# Patient Record
Sex: Male | Born: 1965 | Race: White | Hispanic: No | Marital: Married | Smoking: Current every day smoker
Health system: Southern US, Community
[De-identification: ages and names within clinical notes are randomized; demographics above are authoritative.]

## PROBLEM LIST (undated history)

## (undated) HISTORY — PX: GASTRIC BYPASS: SHX52

## (undated) HISTORY — PX: APPENDECTOMY: SHX54

## (undated) HISTORY — PX: LASER ABLATION: SHX1947

---

## 2006-11-28 ENCOUNTER — Ambulatory Visit: Payer: Self-pay | Admitting: Gastroenterology

## 2006-11-28 IMAGING — US ABDOMEN ULTRASOUND
1 series · 17 of 25 positions shown · non-contrast
Comparison: none

REASON FOR EXAM: Abd Pain Generalized
COMMENTS:

[Series 1: abdomen ultrasound · 17 of 56 slices shown]
[im 1/56]
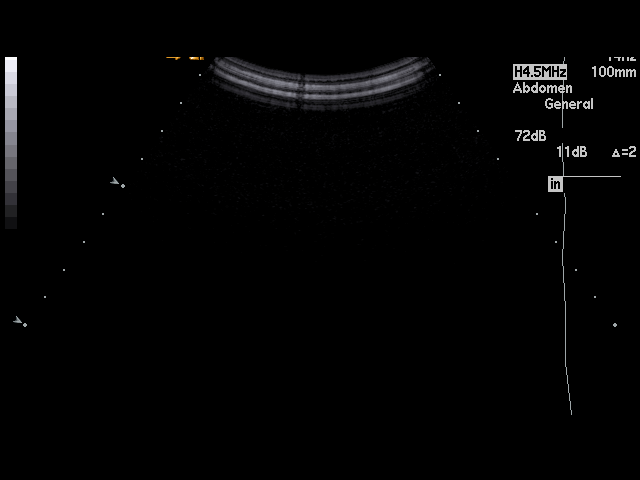
[im 5/56]
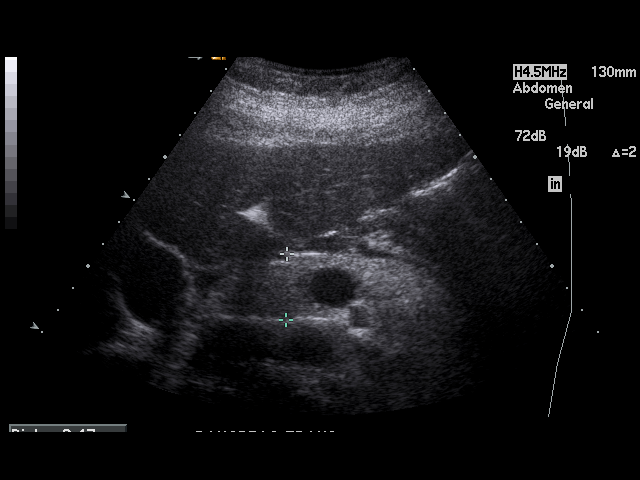
[im 7/56]
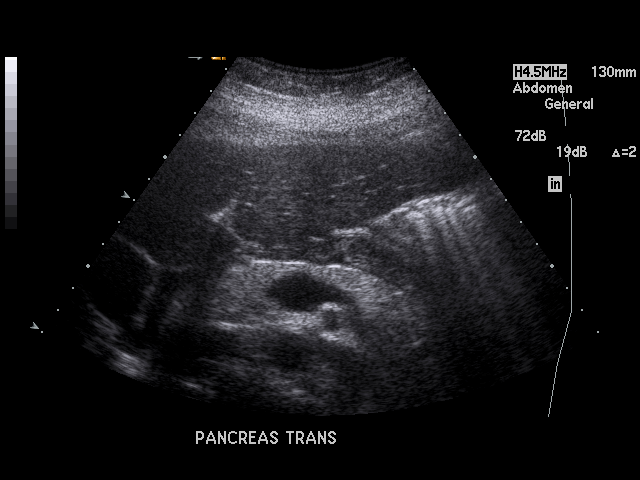
[im 12/56]
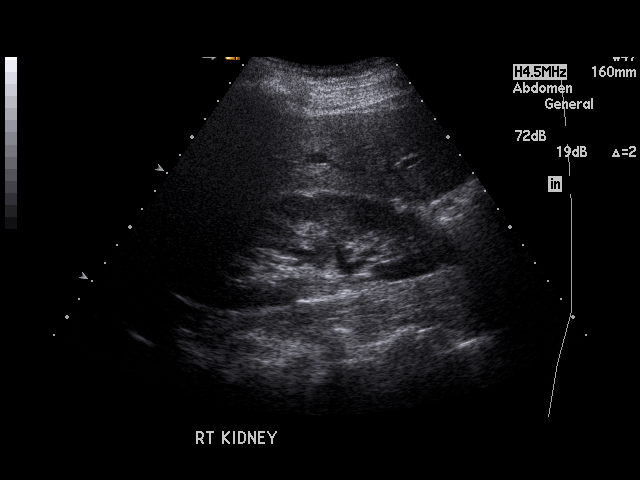
[im 14/56]
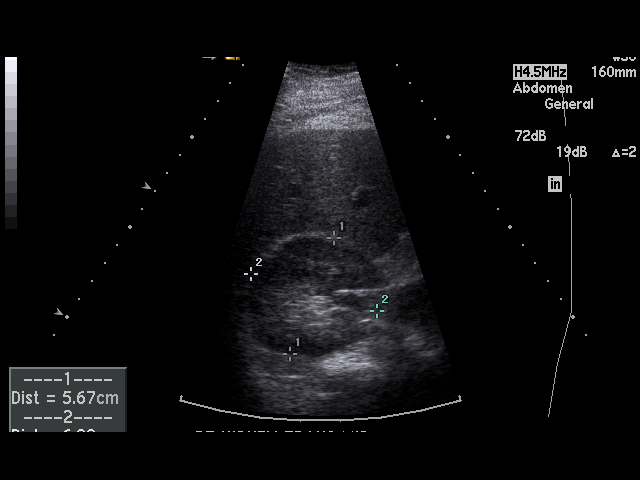
[im 19/56]
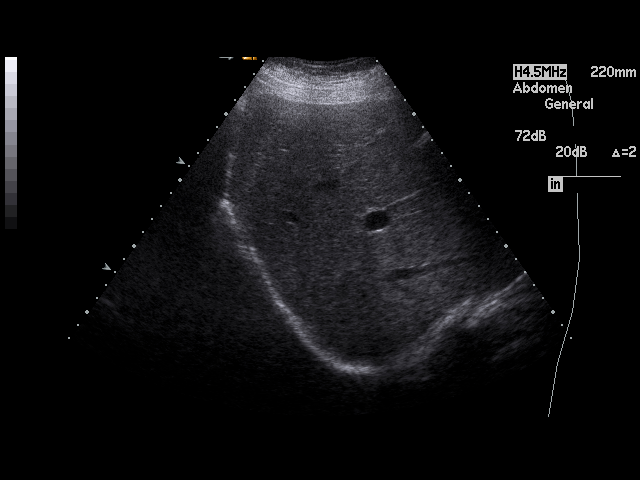
[im 21/56]
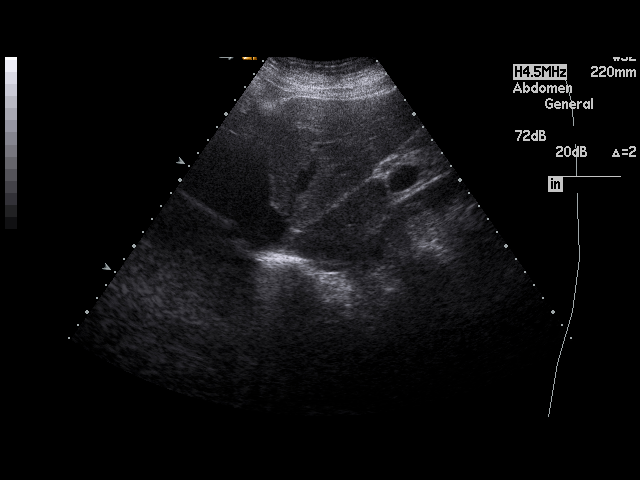
[im 26/56]
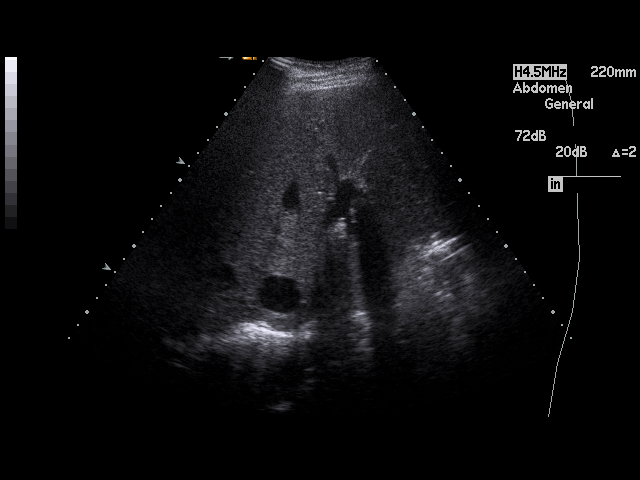
[im 28/56]
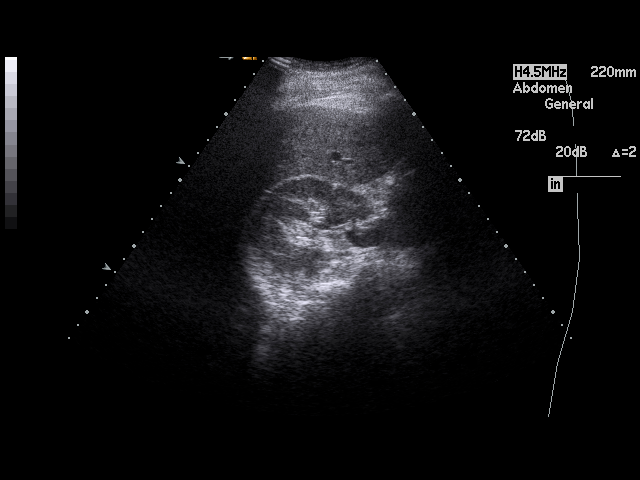
[im 30/56]
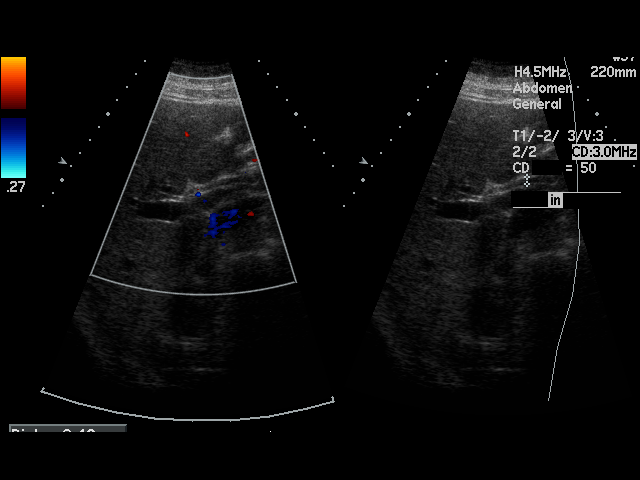
[im 35/56]
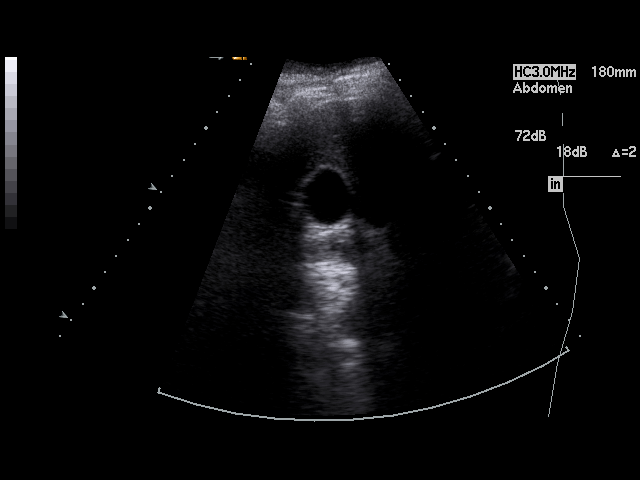
[im 37/56]
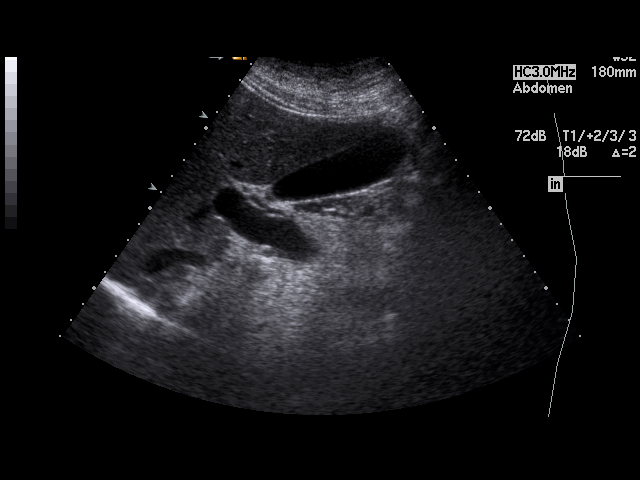
[im 42/56]
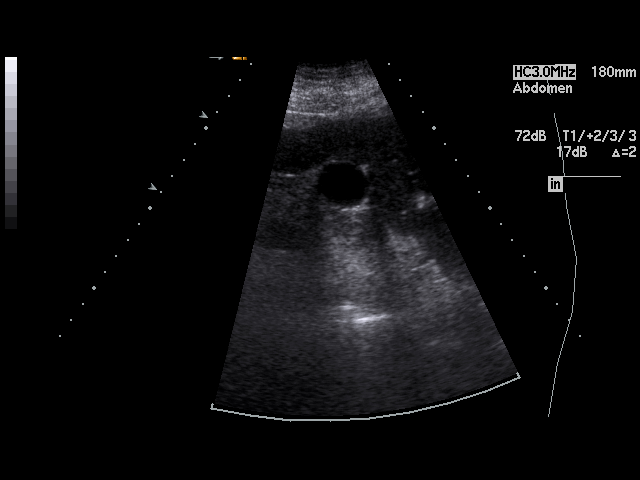
[im 44/56]
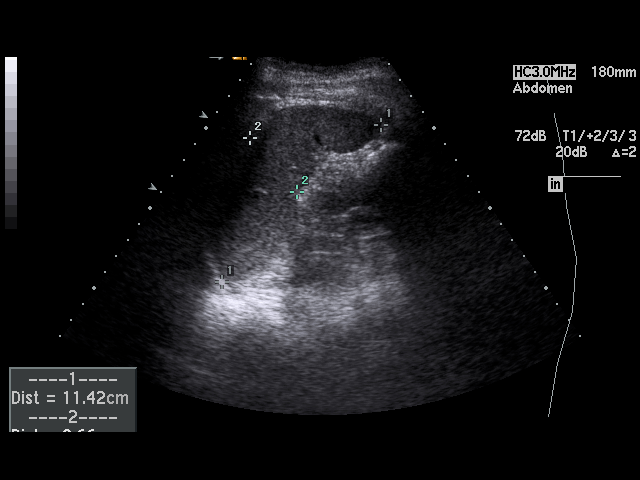
[im 49/56]
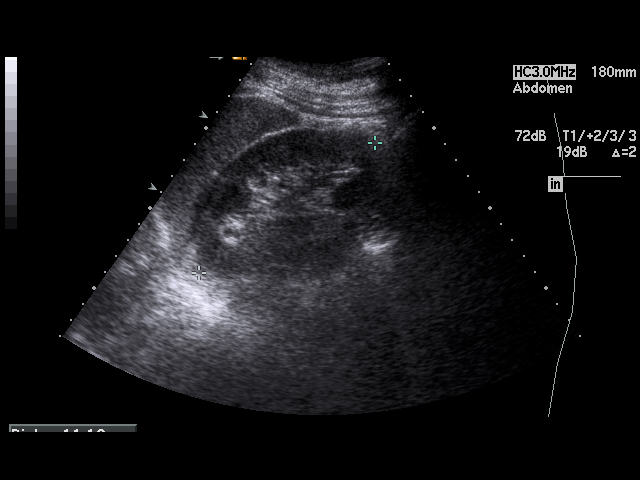
[im 51/56]
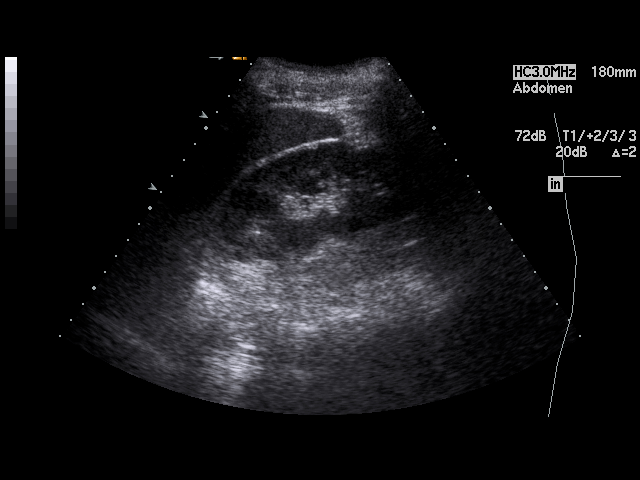
[im 56/56]
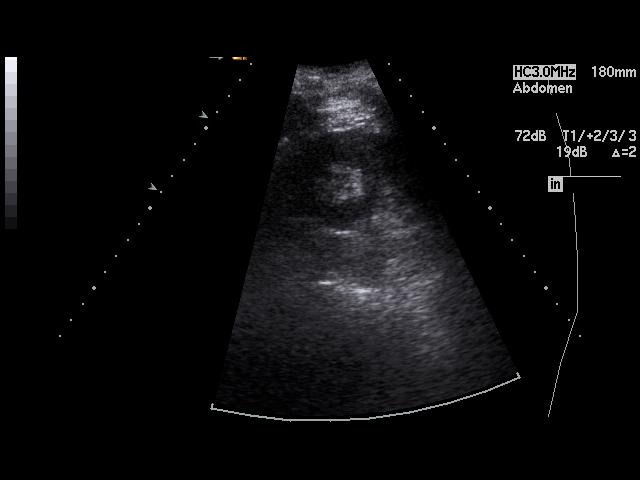

[17 of 25 positions shown; findings below may reference images not displayed]

PROCEDURE:     US  - US ABDOMEN GENERAL SURVEY  - [DATE]  [DATE]

RESULT:     The liver, spleen, pancreas and abdominal aorta show no
significant abnormalities. No gallstones are seen. There is no thickening of
the gallbladder wall. The common bile duct measured 4.3 mm in diameter which
is within normal. The kidneys show no hydronephrosis. There is no ascites.
IMPRESSION: No significant abnormalities are noted.

## 2007-01-02 ENCOUNTER — Ambulatory Visit: Payer: Self-pay | Admitting: Gastroenterology

## 2007-01-02 IMAGING — NM NUCLEAR MEDICINE HEPATOHBILIARY INCLUDE GB
5 series · 26 of 26 positions shown · non-contrast
Comparison: none

REASON FOR EXAM: epigastric pain
COMMENTS:

[Series 1000: gallbladder dynamic · 4.80mm/px · 6 of 60 frames shown]
[frame 6/60]
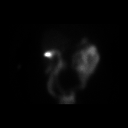
[frame 16/60]
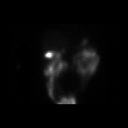
[frame 26/60]
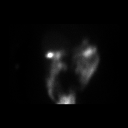
[frame 36/60]
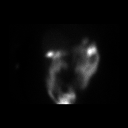
[frame 46/60]
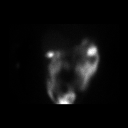
[frame 56/60]
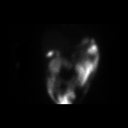

[Series 1000: gallbladder statics · 4.80mm/px · 5 of 5 slices shown (1 of 2)]
[im 1/5]
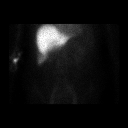
[im 2/5]
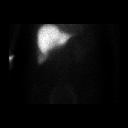
[im 3/5]
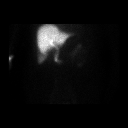
[im 4/5]
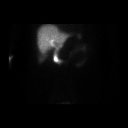
[im 5/5]
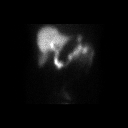

[Series 1000: gallbladder statics · 4.80mm/px · 2 of 2 slices shown (2 of 2)]
[im 1/2]
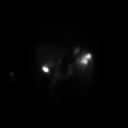
[im 2/2]
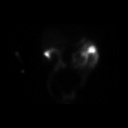

[Series 1000: gallbladder dynamic (results) · 4.80mm/px · 6 of 60 frames shown]
[frame 6/60]
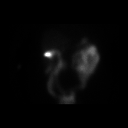
[frame 16/60]
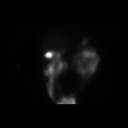
[frame 26/60]
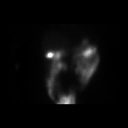
[frame 36/60]
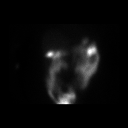
[frame 46/60]
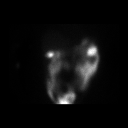
[frame 56/60]
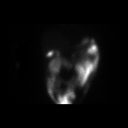

[Series 1000: gallbladder statics (concatenated) · 4.80mm/px · 7 of 7 slices shown]
[im 1/7]
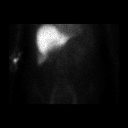
[im 2/7]
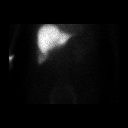
[im 3/7]
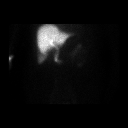
[im 4/7]
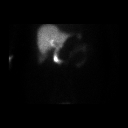
[im 5/7]
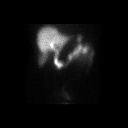
[im 6/7]
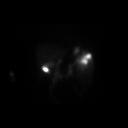
[im 7/7]
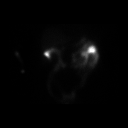

[26 of 26 positions shown; findings below may reference images not displayed]

PROCEDURE:     NM  - NM HEPATO WITH GB EJECT FRACTION  - [DATE]  [DATE]

RESULT:      The patient received 7.4 mCi of technetium 99m labeled
Choletec. The patient also received an intravenous drip infusion of 1.8 mcg
of sincalide over 30 minutes. There is adequate uptake of the
radiopharmaceutical by the liver. Activity is present within the common bile
duct and proximal bowel x 10 minutes. The gallbladder is visualized after
approximately 50 minutes. The 30 minute gallbladder ejection fraction is
normal at 74%.
IMPRESSION: Normal hepatobiliary scan and gallbladder ejection fraction.

## 2015-06-27 ENCOUNTER — Other Ambulatory Visit: Payer: Self-pay | Admitting: Nurse Practitioner

## 2015-06-27 DIAGNOSIS — R1903 Right lower quadrant abdominal swelling, mass and lump: Secondary | ICD-10-CM

## 2015-06-28 ENCOUNTER — Other Ambulatory Visit: Payer: Self-pay | Admitting: Nurse Practitioner

## 2015-06-28 DIAGNOSIS — Z9049 Acquired absence of other specified parts of digestive tract: Secondary | ICD-10-CM

## 2015-06-28 DIAGNOSIS — R10823 Right lower quadrant rebound abdominal tenderness: Secondary | ICD-10-CM

## 2015-06-28 DIAGNOSIS — R1903 Right lower quadrant abdominal swelling, mass and lump: Secondary | ICD-10-CM

## 2015-07-04 ENCOUNTER — Ambulatory Visit
Admission: RE | Admit: 2015-07-04 | Discharge: 2015-07-04 | Disposition: A | Discharge status: Home / Self Care | Payer: BC Managed Care – PPO | Source: Ambulatory Visit | Attending: Nurse Practitioner | Admitting: Nurse Practitioner

## 2015-07-04 DIAGNOSIS — R10823 Right lower quadrant rebound abdominal tenderness: Secondary | ICD-10-CM

## 2015-07-04 DIAGNOSIS — Z9884 Bariatric surgery status: Secondary | ICD-10-CM | POA: Diagnosis not present

## 2015-07-04 DIAGNOSIS — N400 Enlarged prostate without lower urinary tract symptoms: Secondary | ICD-10-CM | POA: Insufficient documentation

## 2015-07-04 DIAGNOSIS — R1903 Right lower quadrant abdominal swelling, mass and lump: Secondary | ICD-10-CM

## 2015-07-04 DIAGNOSIS — Z9049 Acquired absence of other specified parts of digestive tract: Secondary | ICD-10-CM

## 2015-07-04 IMAGING — CT CT ABD-PELV W/ CM
1 of 3 series · 13 of 32 positions shown, 18 images · IV contrast (APPLIED)
Comparison: None.

CLINICAL DATA: 49-year-old with acute onset of right lower quadrant
abdominal pain and swelling which began approximately 1 week ago.
Surgical history includes appendectomy (ruptured appendix) in [DATE] in [place] and prior gastric bypass in [[place]].

EXAM:
CT ABDOMEN AND PELVIS WITH CONTRAST
TECHNIQUE: Multidetector CT imaging of the abdomen and pelvis was performed
using the standard protocol following bolus administration of
intravenous contrast.
CONTRAST:  100 ml Isovue 370 IV. Oral contrast was also
administered.

[Series 2: axial st · axial · 0.84mm/px · z∈[-1028,-578]mm · 13 of 104 slices shown, 18 images]
[im 7/104  soft-tissue]
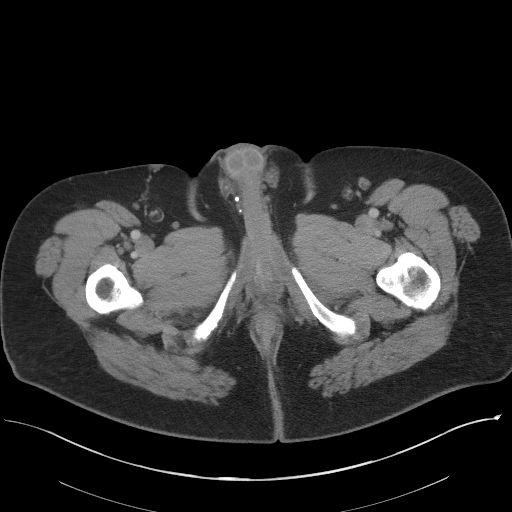
[im 7/104  bone]
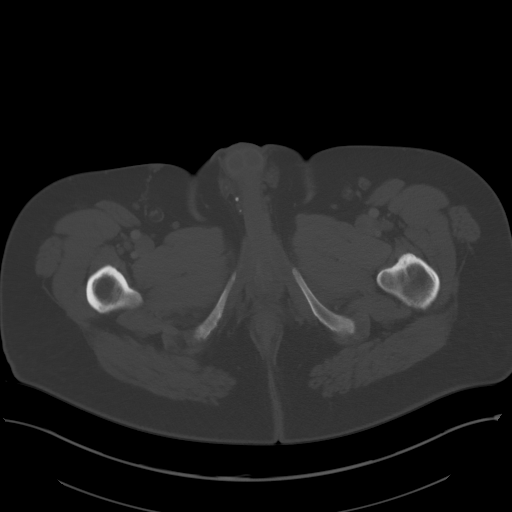
[im 19/104  soft-tissue]
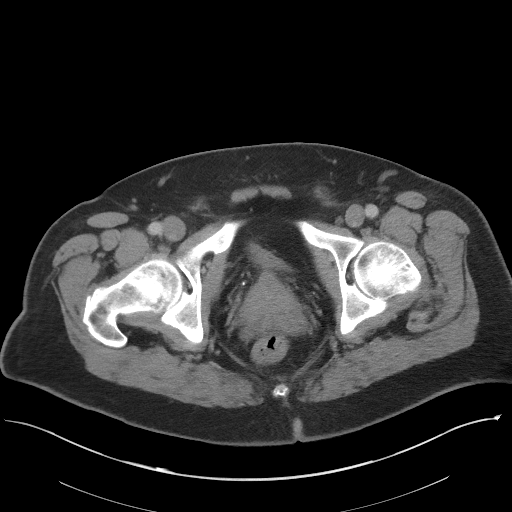
[im 25/104  soft-tissue]
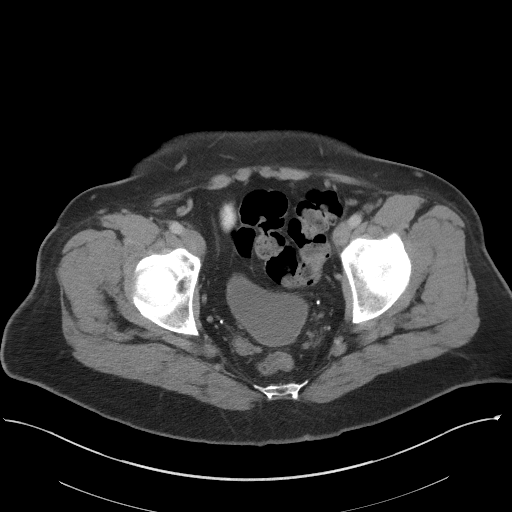
[im 31/104  soft-tissue]
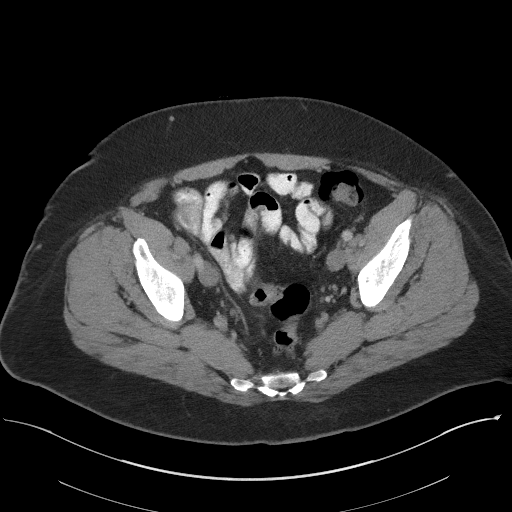
[im 43/104  soft-tissue]
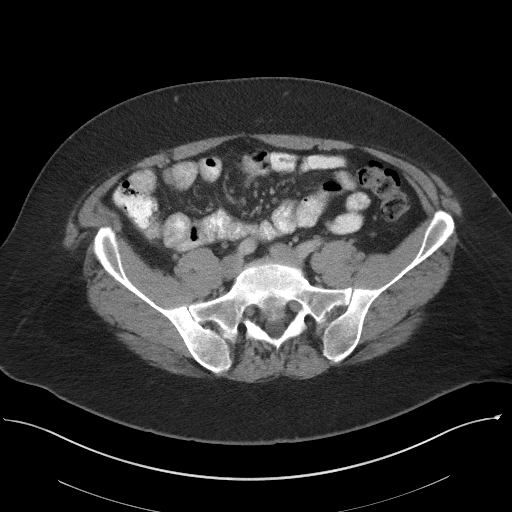
[im 49/104  soft-tissue]
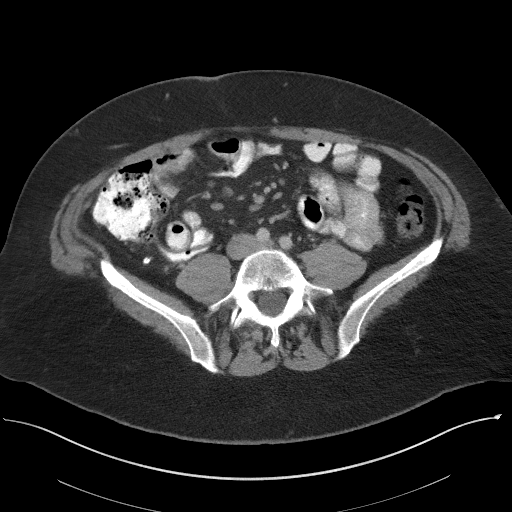
[im 55/104  soft-tissue]
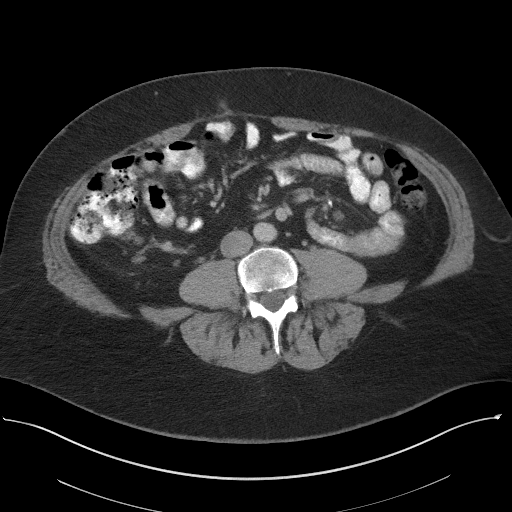
[im 67/104  soft-tissue]
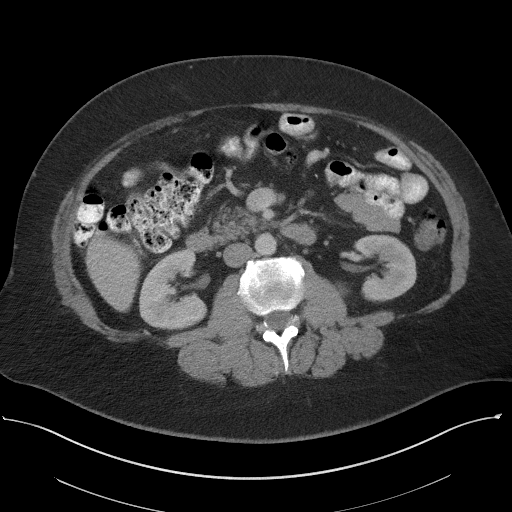
[im 73/104  soft-tissue]
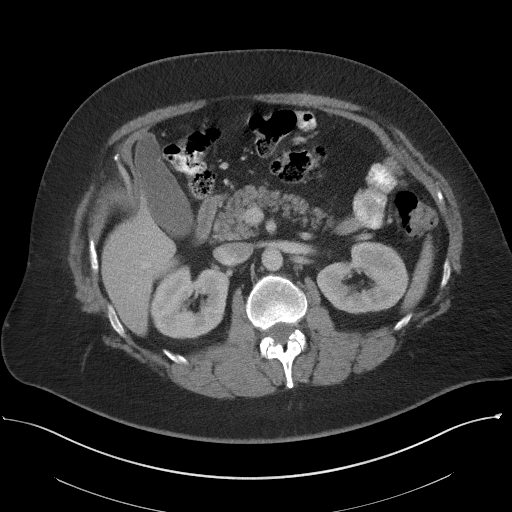
[im 73/104  bone]
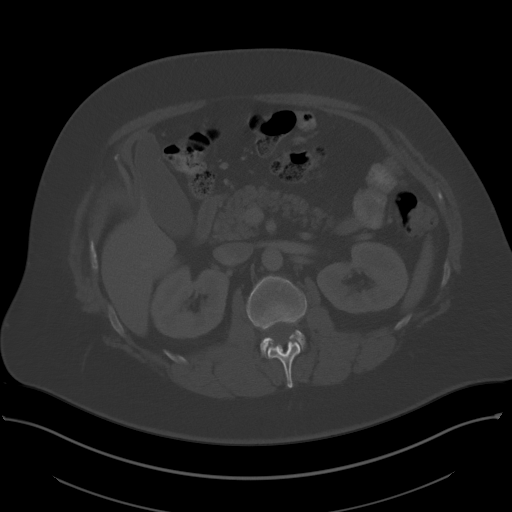
[im 79/104  soft-tissue]
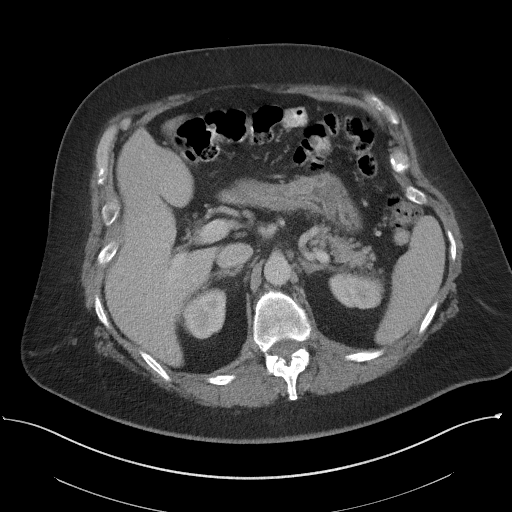
[im 79/104  lung]
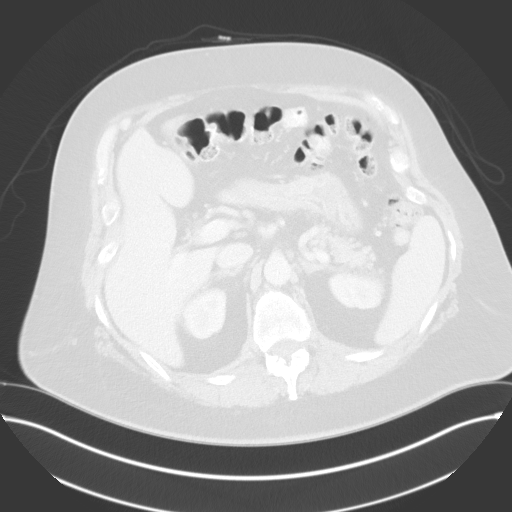
[im 85/104  lung]
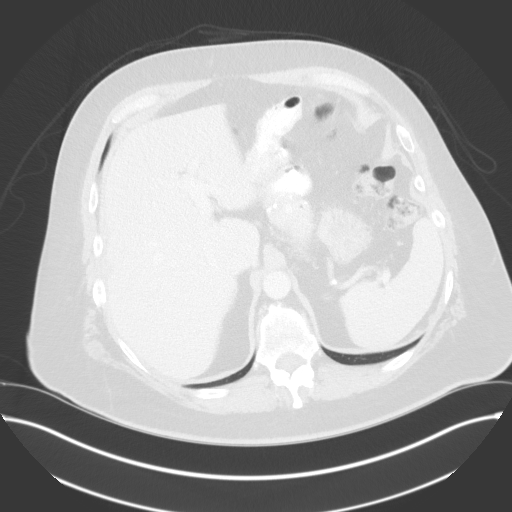
[im 91/104  soft-tissue]
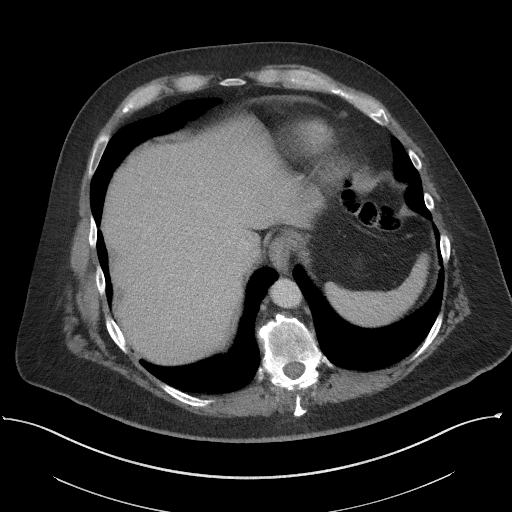
[im 91/104  lung]
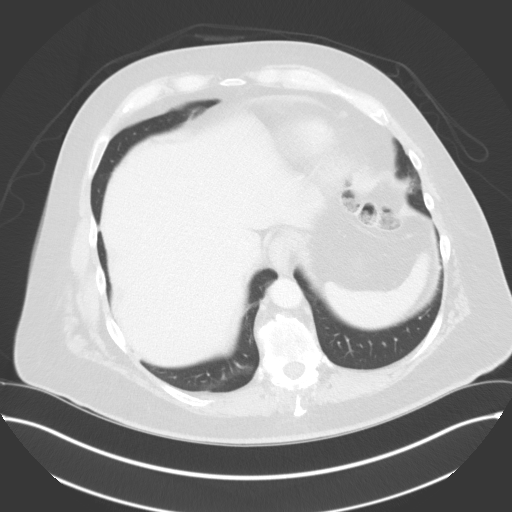
[im 97/104  soft-tissue]
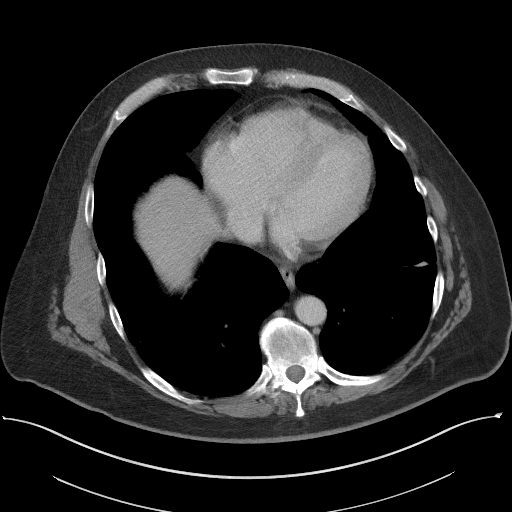
[im 97/104  lung]
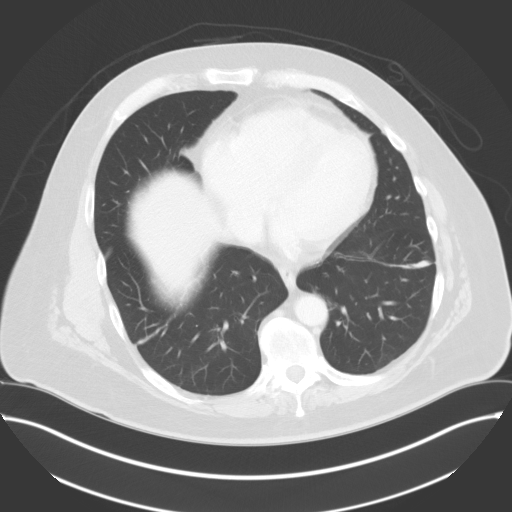

[13 of 32 positions shown; findings below may reference images not displayed]

FINDINGS: Lower chest: Scarring involving the visualized lower lobes.
Visualized lung bases otherwise clear. Heart size upper normal.

Hepatobiliary: Liver normal in size and appearance. Gallbladder
normal in appearance without calcified gallstones. No biliary ductal
dilation.

Pancreas: Normal in appearance without evidence of mass, ductal
dilation, or inflammation.

Spleen: Normal in size and appearance. Focus of accessory splenic
tissue medial to the spleen below the hilum.

Adrenals/Urinary Tract: Normal appearing adrenal glands. Kidneys
normal in size and appearance without focal parenchymal abnormality.
No evidence of urinary tract calculi or obstruction.
Normal-appearing decompressed urinary bladder.

Stomach/Bowel: Prior Roux-en-Y gastric bypass without apparent
complications. Normal-appearing small bowel. Moderate stool burden
throughout normal appearing colon. Appendix surgically absent.

Vascular/Lymphatic: No visible aortoiliofemoral atherosclerosis.
Widely patent visceral arteries. Normal-appearing portal venous and
systemic venous systems. No pathologic lymphadenopathy.

Reproductive: Prostate gland upper normal in size for age with
borderline median lobe enlargement. Normal seminal vesicles.

Other: Phleboliths low in both sides of the pelvis. No ascites. No
evidence of anterior abdominal wall hernia to explain the patient's
symptoms.

Musculoskeletal: Degenerative disc disease and spondylosis
throughout the lower thoracic and lumbar spine. Ununited apophysis
at the anterior superior endplate of L4. Chronic disc protrusion at
L5-S1 with calcification in the posterior annular fibers.
IMPRESSION: 1. No acute abnormalities involving the abdomen or pelvis.
2. Prior gastric bypass without complicating features.
3. Borderline median lobe prostate gland enlargement.

## 2015-07-04 MED ORDER — IOPAMIDOL (ISOVUE-370) INJECTION 76%
100.0000 mL | Freq: Once | INTRAVENOUS | Status: AC | PRN
  Administered 2015-07-04: 100 mL via INTRAVENOUS

## 2015-07-06 ENCOUNTER — Ambulatory Visit: Payer: Self-pay

## 2016-02-06 ENCOUNTER — Encounter (INDEPENDENT_AMBULATORY_CARE_PROVIDER_SITE_OTHER): Payer: BC Managed Care – PPO | Admitting: Vascular Surgery

## 2016-02-09 ENCOUNTER — Encounter (INDEPENDENT_AMBULATORY_CARE_PROVIDER_SITE_OTHER): Payer: BC Managed Care – PPO | Admitting: Vascular Surgery

## 2016-02-13 ENCOUNTER — Encounter (INDEPENDENT_AMBULATORY_CARE_PROVIDER_SITE_OTHER): Payer: Self-pay | Admitting: Vascular Surgery

## 2016-02-13 ENCOUNTER — Ambulatory Visit (INDEPENDENT_AMBULATORY_CARE_PROVIDER_SITE_OTHER): Payer: BC Managed Care – PPO | Admitting: Vascular Surgery

## 2016-02-13 VITALS — BP 124/87 | HR 77 | Ht 73.0 in | Wt 278.0 lb

## 2016-02-13 DIAGNOSIS — I809 Phlebitis and thrombophlebitis of unspecified site: Secondary | ICD-10-CM | POA: Insufficient documentation

## 2016-02-13 DIAGNOSIS — I83811 Varicose veins of right lower extremities with pain: Secondary | ICD-10-CM | POA: Diagnosis not present

## 2016-02-13 DIAGNOSIS — I8001 Phlebitis and thrombophlebitis of superficial vessels of right lower extremity: Secondary | ICD-10-CM | POA: Diagnosis not present

## 2016-02-13 NOTE — Assessment & Plan Note (Signed)
Recent episode of what sounds like superficial thrombophlebitis of the right anterior accessory saphenous vein. This is improved, but a large painful varicosity remains.

## 2016-02-13 NOTE — Progress Notes (Signed)
Patient ID: Alex Patel, male   DOB: 08-06-1965, 50 y.o.   MRN: [id number]  Chief Complaint  Patient presents with  . New Patient (Initial Visit)    HPI Alex Patel is a 50 y.o. male.  I am asked to see the patient by Dr. Maryjane HurterFeldpausch for evaluation of painful varicose veins.  The patient presents with complaints of symptomatic varicosities of the right leg. The patient reports a long standing history of varicosities and they have become painful over time. He had veins in both legs for about 10 years before seeking treatment and had what sounds like either a laser or radiofrequency ablation about 5-6 years ago. There was no clear inciting event or causative factor that started the symptoms. He developed what sounds like a superficial thrombophlebitis of the right anterior accessory saphenous vein a month or 2 ago. The vein became very red, tender, and swollen. This took several weeks to get better. He has been left with large painful varicosities in that location since this event The right leg is more severly affected. The patient elevates the legs for relief. The pain is described as aching and stinging. The symptoms are generally most severe in the evening, particularly when they have been on their feet for long periods of time. He has been using compression stockings but these have not really helped The patient complains of occasional swelling as an associated symptom. The patient has no previous history of deep venous thrombosis or superficial thrombophlebitis to their knowledge.     History reviewed. No pertinent past medical history.  Past Surgical History:  Procedure Laterality Date  . APPENDECTOMY    . GASTRIC BYPASS    . LASER ABLATION Bilateral     Family History  Problem Relation Age of Onset  . Hypertension Mother   . Varicose Veins Father   . Cancer Maternal Grandmother   . Diabetes Maternal Grandmother   . Varicose Veins Paternal Grandmother      Social  History Social History  Substance Use Topics  . Smoking status: Never Smoker  . Smokeless tobacco: Never Used  . Alcohol use No     No Known Allergies  Current Outpatient Prescriptions  Medication Sig Dispense Refill  . SUMAtriptan (IMITREX) 100 MG tablet take 1 tablet by mouth immediately may repeat after 2 hours if needed    . SUMAtriptan Succinate Refill 6 MG/0.5ML SOCT inject 1 dose subcutaneously for migraines may repeat in 1 hour IF migraines PERSISTS     No current facility-administered medications for this visit.       REVIEW OF SYSTEMS (Negative unless checked)  Constitutional: [] Weight loss  [] Fever  [] Chills Cardiac: [] Chest pain   [] Chest pressure   [] Palpitations   [] Shortness of breath when laying flat   [] Shortness of breath at rest   [] Shortness of breath with exertion. Vascular:  [] Pain in legs with walking   [] Pain in legs at rest   [] Pain in legs when laying flat   [] Claudication   [] Pain in feet when walking  [] Pain in feet at rest  [] Pain in feet when laying flat   [] History of DVT   [] Phlebitis   [x] Swelling in legs   [x] Varicose veins   [] Non-healing ulcers Pulmonary:   [] Uses home oxygen   [] Productive cough   [] Hemoptysis   [] Wheeze  [] COPD   [] Asthma Neurologic:  [] Dizziness  [] Blackouts   [] Seizures   [] History of stroke   [] History of TIA  [] Aphasia   [] Temporary blindness   []   Dysphagia   [] Weakness or numbness in arms   [] Weakness or numbness in legs Musculoskeletal:  [] Arthritis   [] Joint swelling   [] Joint pain   [] Low back pain Hematologic:  [] Easy bruising  [] Easy bleeding   [] Hypercoagulable state   [] Anemic  [] Hepatitis Gastrointestinal:  [] Blood in stool   [] Vomiting blood  [] Gastroesophageal reflux/heartburn   [] Abdominal pain Genitourinary:  [] Chronic kidney disease   [] Difficult urination  [] Frequent urination  [] Burning with urination   [] Hematuria Skin:  [] Rashes   [] Ulcers   [] Wounds Psychological:  [] History of anxiety   []  History of major  depression.    Physical Exam BP 124/87   Pulse 77   Ht 6\' 1"  (1.854 m)   Wt 278 lb (126.1 kg)   BMI 36.68 kg/m  Gen:  WD/WN, NAD Head: [place]/AT, No temporalis wasting.  Ear/Nose/Throat: Hearing grossly intact, dentition good Eyes: Sclera non-icteric. Conjunctiva clear Neck: Supple, no nuchal rigidity. Trachea midline Pulmonary:  Good air movement, no use of accessory muscles, respirations not labored.  Cardiac: RRR, No JVD Vascular: Varicosities extensive coursing from the anterior thigh across the lateral knee and anterior lateral calf and measuring up to 4-5 mm in the right lower extremity        Varicosities scattered and measuring up to 2-3 mm in the left lower extremity Vessel Right Left  Radial Palpable Palpable  Ulnar Palpable Palpable  Brachial Palpable Palpable  Carotid Palpable, without bruit Palpable, without bruit  Aorta Not palpable N/A  Femoral Palpable Palpable  Popliteal Palpable Palpable  PT Palpable Palpable  DP Palpable Palpable   Gastrointestinal: soft, non-tender/non-distended. No guarding/reflex. No masses, surgical incisions, or scars. Musculoskeletal: M/S 5/5 throughout.   trace RLE edema.   LLE edema Neurologic: Sensation grossly intact in extremities.  Symmetrical.  Speech is fluent.  Psychiatric: Judgment intact, Mood & affect appropriate for pt's clinical situation. Dermatologic: No rashes or ulcers noted.  No cellulitis or open wounds. Lymph : No Cervical, Axillary, or Inguinal lymphadenopathy.   Radiology No results found.  Labs No results found for this or any previous visit (from the past 2160 hour(s)).  Assessment/Plan:  Superficial thrombophlebitis Recent episode of what sounds like superficial thrombophlebitis of the right anterior accessory saphenous vein. This is improved, but a large painful varicosity remains.  Varicose veins of leg with pain, right Patient has had previous treatment for symptomatic venous insufficiency of both  lower extremities. Recurrent symptoms are present. Also had an episode of thrombophlebitis. Continue conservative therapies as described below. Plan duplex and follow-up in 3 months.    The patient has symptoms consistent with chronic venous insufficiency. We discussed the natural history and treatment options for venous disease. I recommended the regular use of 20 - 30 mm Hg compression stockings, and prescribed these today. I recommended leg elevation and anti-inflammatories as needed for pain. I have also recommended a complete venous duplex to assess the venous system for reflux or thrombotic issues. This can be done at the patient's convenience. I will see the patient back in 3 months to assess the response to conservative management, and determine further treatment options.     Festus BarrenJason Dew 02/13/2016, 3:53 PM   This note was created with Dragon medical transcription system.  Any errors from dictation are unintentional.

## 2016-02-13 NOTE — Assessment & Plan Note (Signed)
Patient has had previous treatment for symptomatic venous insufficiency of both lower extremities. Recurrent symptoms are present. Also had an episode of thrombophlebitis. Continue conservative therapies as described below. Plan duplex and follow-up in 3 months.

## 2016-05-14 ENCOUNTER — Encounter (INDEPENDENT_AMBULATORY_CARE_PROVIDER_SITE_OTHER): Payer: BC Managed Care – PPO

## 2016-05-14 ENCOUNTER — Ambulatory Visit (INDEPENDENT_AMBULATORY_CARE_PROVIDER_SITE_OTHER): Payer: BC Managed Care – PPO | Admitting: Vascular Surgery

## 2016-07-05 ENCOUNTER — Ambulatory Visit (INDEPENDENT_AMBULATORY_CARE_PROVIDER_SITE_OTHER): Payer: BC Managed Care – PPO | Admitting: Vascular Surgery

## 2016-07-05 ENCOUNTER — Ambulatory Visit (INDEPENDENT_AMBULATORY_CARE_PROVIDER_SITE_OTHER): Payer: BC Managed Care – PPO

## 2016-07-05 DIAGNOSIS — I83811 Varicose veins of right lower extremities with pain: Secondary | ICD-10-CM

## 2016-07-05 NOTE — Assessment & Plan Note (Signed)
See plan as below 

## 2016-07-05 NOTE — Progress Notes (Signed)
Patient ID: Alex Patel, male   DOB: [birth year], 51 y.o.   MRN: [id number]  Chief Complaint  Patient presents with  . Re-evaluation    3 month follow up ultrasound    HPI Alex Patel is a 51 y.o. male.  Patient returns in follow up of their venous disease.  They have done their best to comply with the prescribed conservative therapies of compression stockings, leg elevation, exercise, and still requires anti-inflammatories for discomfort and has symptoms that are persistent and bothersome on a daily basis, affecting their activities of daily living and normal activities.  There has been some improvement. A venous reflux study demonstrates right small saphenous vein reflux as well as reflux in the right anterior accessory saphenous vein and the segment of residual great saphenous vein that appears to have recanalized.  HPI  History reviewed. No pertinent past medical history.       Past Surgical History:  Procedure Laterality Date  . APPENDECTOMY    . GASTRIC BYPASS    . LASER ABLATION Bilateral          Family History  Problem Relation Age of Onset  . Hypertension Mother   . Varicose Veins Father   . Cancer Maternal Grandmother   . Diabetes Maternal Grandmother   . Varicose Veins Paternal Grandmother      Social History     Social History  Substance Use Topics  . Smoking status: Never Smoker  . Smokeless tobacco: Never Used  . Alcohol use No     No Known Allergies        Current Outpatient Prescriptions  Medication Sig Dispense Refill  . SUMAtriptan (IMITREX) 100 MG tablet take 1 tablet by mouth immediately may repeat after 2 hours if needed    . SUMAtriptan Succinate Refill 6 MG/0.5ML SOCT inject 1 dose subcutaneously for migraines may repeat in 1 hour IF migraines PERSISTS     No current facility-administered medications for this visit.       REVIEW OF SYSTEMS (Negative unless checked)  Constitutional: Weight loss   Fever  Chills Cardiac: Chest pain   Chest pressure   Palpitations   Shortness of breath when laying flat   Shortness of breath at rest   Shortness of breath with exertion. Vascular:  Pain in legs with walking   Pain in legs at rest   Pain in legs when laying flat   Claudication   Pain in feet when walking  Pain in feet at rest  Pain in feet when laying flat   History of DVT   Phlebitis   Swelling in legs   Varicose veins   Non-healing ulcers Pulmonary:   Uses home oxygen   Productive cough   Hemoptysis   Wheeze  COPD   Asthma Neurologic:  Dizziness  Blackouts   Seizures   History of stroke   History of TIA  Aphasia   Temporary blindness   Dysphagia   Weakness or numbness in arms   Weakness or numbness in legs Musculoskeletal:  Arthritis   Joint swelling   Joint pain   Low back pain Hematologic:  Easy bruising  Easy bleeding   Hypercoagulable state   Anemic  Hepatitis Gastrointestinal:  Blood in stool   Vomiting blood  Gastroesophageal reflux/heartburn   Abdominal pain Genitourinary:  Chronic kidney disease   Difficult urination  Frequent urination  Burning with urination   Hematuria Skin:  Rashes   Ulcers   Wounds Psychological:  History of anxiety     History of major depression.    Physical Exam There were no vitals taken for this visit. Gen:  WD/WN, NAD Head: [place]/AT, No temporalis wasting.  Ear/Nose/Throat: Hearing grossly intact, dentition good Eyes: Sclera non-icteric. Conjunctiva clear Neck: Supple. Trachea midline Pulmonary:  Good air movement, no use of accessory muscles, respirations not labored.  Cardiac: RRR, No JVD Vascular: Varicosities extensive and measuring up to 5 mm in the right lower extremity        Varicosities scattered and measuring up to 2-3 mm in the left lower extremity Vessel Right Left  Radial Palpable Palpable  Ulnar Palpable Palpable    Brachial Palpable Palpable  Carotid Palpable, without bruit Palpable, without bruit  Aorta Not palpable N/A  Femoral Palpable Palpable  Popliteal Palpable Palpable  PT Palpable Palpable  DP Palpable Palpable   Gastrointestinal: soft, non-tender/non-distended Musculoskeletal: M/S 5/5 throughout.   1 + RLE edema.  No LLE edema Neurologic: Sensation grossly intact in extremities.  Symmetrical.  Speech is fluent.  Psychiatric: Judgment intact, Mood & affect appropriate for pt's clinical situation. Dermatologic: No rashes or ulcers noted.  No cellulitis or open wounds.    Radiology No results found.  Labs No results found for this or any previous visit (from the past 2160 hour(s)).  Assessment/Plan:  Varicose veins of leg with pain, right See plan as below   The patient has done their best to comply with conservative therapy of 20-30 mm Hg compression stockings, leg elevation, exercise, and anti-inflammatories as needed for discomfort.  Despite this, they continue to have daily and persistent symptoms from their venous disease.  A venous reflux study demonstrates right small saphenous vein reflux as well as reflux in the right anterior accessory saphenous vein and the segment of residual great saphenous vein that appears to have recanalized.  As such, the patient is likely to benefit from endovenous laser ablation of the right GSV that has recanalized and the right SSV.  Foam sclerotherapy would likely be helpful for the anterior accessory saphenous vein as well.  Risks and benefits of the procedure including bleeding, infection, recanalization, DVT, and need for further therapy for residual varicosities were discussed.  The patient voices their understanding and is agreeable to proceed.     Festus Barren 07/05/2016, 4:41 PM

## 2016-08-02 ENCOUNTER — Encounter (INDEPENDENT_AMBULATORY_CARE_PROVIDER_SITE_OTHER): Payer: Self-pay | Admitting: Vascular Surgery

## 2016-08-02 ENCOUNTER — Ambulatory Visit (INDEPENDENT_AMBULATORY_CARE_PROVIDER_SITE_OTHER): Payer: BC Managed Care – PPO | Admitting: Vascular Surgery

## 2016-08-02 VITALS — BP 121/81 | HR 94 | Resp 16 | Ht 72.0 in | Wt 280.0 lb

## 2016-08-02 DIAGNOSIS — I83811 Varicose veins of right lower extremities with pain: Secondary | ICD-10-CM | POA: Diagnosis not present

## 2016-08-02 NOTE — Assessment & Plan Note (Signed)
See laser

## 2016-08-02 NOTE — Progress Notes (Signed)
Varicose veins of leg with pain, right See laser   The patient's right lower extremity was sterilely prepped and draped. The ultrasound machine was used to visualize the saphenous vein throughout its course. A segment in the upper calf was selected for access. The saphenous vein was accessed without difficulty using ultrasound guidance with a micro puncture needle. A micro puncture wire and sheath were then placed. A 0.018 wire was placed to where the vein ended in the upper thigh through the sheath and the micro puncture sheath was removed. The 65 cm sheath was then placed over the wire and the wire and dilator were removed. The laser fiber was placed through the sheath. Tumescent anesthesia was then created with a dilute lidocaine solution. Laser energy was then delivered with constant withdrawal of the sheath and laser fiber. Approximately 1227 Joules of energy were delivered over a length of 33 cm using the 1470 Hz Vena[employer]ure machine at [employer]. The patient tolerated the procedure well without complications. I then turned my attention to the SSV    The patient's right lower extremity was sterilely prepped and draped. The ultrasound machine was used to visualize the lesser saphenous vein throughout its course. A segment in the lower calf was selected for access. The lesser saphenous vein was accessed without difficulty using ultrasound guidance with a micro puncture needle. A 0.018 wire was placed beyond the small saphenous-popliteal junction. The 65cm sheath was placed over the wire and the wire and dilator were removed. The laser fiber was placed through the sheath and its tip was placed approximately 4-5 cm below the small saphenous-popliteal junction. Tumescent anesthesia was then created with a dilute lidocaine solution. Laser energy was then delivered with constant withdrawal of the sheath and laser fiber. Approximately 961 Joules of energy were delivered over a length of 24 cm using the 1470 Hz Venocare  machine at [employer]. Sterile dressings were placed. The patient tolerated the procedure well without complications.

## 2016-08-06 ENCOUNTER — Ambulatory Visit (INDEPENDENT_AMBULATORY_CARE_PROVIDER_SITE_OTHER): Payer: BC Managed Care – PPO

## 2016-08-06 DIAGNOSIS — I8001 Phlebitis and thrombophlebitis of superficial vessels of right lower extremity: Secondary | ICD-10-CM | POA: Diagnosis not present

## 2016-08-06 DIAGNOSIS — I83811 Varicose veins of right lower extremities with pain: Secondary | ICD-10-CM | POA: Diagnosis not present

## 2016-09-03 ENCOUNTER — Encounter (INDEPENDENT_AMBULATORY_CARE_PROVIDER_SITE_OTHER): Payer: Self-pay | Admitting: Vascular Surgery

## 2016-09-03 ENCOUNTER — Ambulatory Visit (INDEPENDENT_AMBULATORY_CARE_PROVIDER_SITE_OTHER): Payer: BC Managed Care – PPO | Admitting: Vascular Surgery

## 2016-09-03 VITALS — BP 126/87 | HR 96 | Resp 16 | Wt 282.6 lb

## 2016-09-03 DIAGNOSIS — I83811 Varicose veins of right lower extremities with pain: Secondary | ICD-10-CM | POA: Diagnosis not present

## 2016-09-03 DIAGNOSIS — I8001 Phlebitis and thrombophlebitis of superficial vessels of right lower extremity: Secondary | ICD-10-CM

## 2016-09-03 NOTE — Assessment & Plan Note (Signed)
The patient is doing well status post laser ablation on the right leg. He reports the residual varicosities are currently not that painful and bothersome, and he is not interested in having these treated with sclerotherapy childhood agree with as his symptoms are minimal. He will call our office these bother him further or if he has recurrent venous insufficiency symptoms in the future.

## 2016-09-03 NOTE — Assessment & Plan Note (Signed)
Resolved

## 2016-09-03 NOTE — Progress Notes (Signed)
MRN : [id number]  Alex Patel is a 51 y.o. (05-11-1965) male who presents with chief complaint of  Chief Complaint  Patient presents with  . post laser follow up  .  History of Present Illness: Patient returns today in follow up of venous insufficiency. He underwent laser ablation on the right leg about 4 weeks ago. His varicosities have become significantly less prominent at his leg is not swelling or becoming red and painful as it was before. The soreness from the procedure lasted a couple of weeks but has largely resolved now. He had no periprocedural complications at his duplex showed successful ablation.  Current Outpatient Prescriptions  Medication Sig Dispense Refill  . ALPRAZolam (XANAX) 0.5 MG tablet     . esomeprazole (NEXIUM) 40 MG capsule Take 40 mg by mouth.    . Multiple Vitamin (MULTI-VITAMINS) TABS Take by mouth.    . SUMAtriptan (IMITREX) 100 MG tablet take 1 tablet by mouth immediately may repeat after 2 hours if needed    . SUMAtriptan 6 MG/0.5ML SOAJ INJECT 1 DOSE UNDER THE SKIN FOR MIGRAINES. MAY REPEAT IN 1 HOUR IF MIGRAINE PERSISTS-MAX OF 18  1  . traZODone (DESYREL) 100 MG tablet take 1 tablet by mouth at bedtime    . verapamil (CALAN-SR) 240 MG CR tablet Take by mouth.     No current facility-administered medications for this visit.     No past medical history on file.  Past Surgical History:  Procedure Laterality Date  . APPENDECTOMY    . GASTRIC BYPASS    . LASER ABLATION Bilateral     Social History Social History  Substance Use Topics  . Smoking status: Never Smoker  . Smokeless tobacco: Never Used  . Alcohol use No    Family History Family History  Problem Relation Age of Onset  . Hypertension Mother   . Varicose Veins Father   . Cancer Maternal Grandmother   . Diabetes Maternal Grandmother   . Varicose Veins Paternal Grandmother     No Known Allergies   REVIEW OF SYSTEMS (Negative unless checked)  Constitutional: [] Weight  loss  [] Fever  [] Chills Cardiac: [] Chest pain   [] Chest pressure   [] Palpitations   [] Shortness of breath when laying flat   [] Shortness of breath at rest   [] Shortness of breath with exertion. Vascular:  [] Pain in legs with walking   [] Pain in legs at rest   [] Pain in legs when laying flat   [] Claudication   [] Pain in feet when walking  [] Pain in feet at rest  [] Pain in feet when laying flat   [] History of DVT   [] Phlebitis   [x] Swelling in legs   [x] Varicose veins   [] Non-healing ulcers Pulmonary:   [] Uses home oxygen   [] Productive cough   [] Hemoptysis   [] Wheeze  [] COPD   [] Asthma Neurologic:  [] Dizziness  [] Blackouts   [] Seizures   [] History of stroke   [] History of TIA  [] Aphasia   [] Temporary blindness   [] Dysphagia   [] Weakness or numbness in arms   [] Weakness or numbness in legs Musculoskeletal:  [] Arthritis   [] Joint swelling   [] Joint pain   [] Low back pain Hematologic:  [] Easy bruising  [] Easy bleeding   [] Hypercoagulable state   [] Anemic   Gastrointestinal:  [] Blood in stool   [] Vomiting blood  [] Gastroesophageal reflux/heartburn   [] Abdominal pain Genitourinary:  [] Chronic kidney disease   [] Difficult urination  [] Frequent urination  [] Burning with urination   [] Hematuria Skin:  [] Rashes   []   Ulcers   [] Wounds Psychological:  [] History of anxiety   []  History of major depression.  Physical Examination  BP 126/87   Pulse 96   Resp 16   Wt 282 lb 9.6 oz (128.2 kg)   BMI 38.33 kg/m  Gen:  WD/WN, NAD Head: [place]/AT, No temporalis wasting. Ear/Nose/Throat: Hearing grossly intact, nares w/o erythema or drainage, trachea midline Eyes: Conjunctiva clear. Sclera non-icteric Neck: Supple.  No JVD.  Pulmonary:  Good air movement, no use of accessory muscles.  Cardiac: RRR, normal S1, S2 Vascular:  Vessel Right Left  Radial Palpable Palpable                                    Musculoskeletal: M/S 5/5 throughout.  No deformity or atrophy. Right anterior accessory saphenous vein  does demonstrate large prominent varicosities measuring 3-4 mm in diameter. Remainder of the varicosities on the right leg are decreased in size and prominence from his exam prior to laser ablation. Neurologic: Sensation grossly intact in extremities.  Symmetrical.  Speech is fluent.  Psychiatric: Judgment intact, Mood & affect appropriate for pt's clinical situation. Dermatologic: No rashes or ulcers noted.  No cellulitis or open wounds.       Labs No results found for this or any previous visit (from the past 2160 hour(s)).  Radiology No results found.  Assessment/Plan  Superficial thrombophlebitis Resolved.  Varicose veins of leg with pain, right The patient is doing well status post laser ablation on the right leg. He reports the residual varicosities are currently not that painful and bothersome, and he is not interested in having these treated with sclerotherapy childhood agree with as his symptoms are minimal. He will call our office these bother him further or if he has recurrent venous insufficiency symptoms in the future.    Festus BarrenJason Aviraj Kentner, MD  09/03/2016 2:39 PM    This note was created with Dragon medical transcription system.  Any errors from dictation are purely unintentional

## 2016-09-04 ENCOUNTER — Emergency Department: Payer: BC Managed Care – PPO

## 2016-09-04 ENCOUNTER — Encounter: Payer: Self-pay | Admitting: *Deleted

## 2016-09-04 ENCOUNTER — Emergency Department
Admission: EM | Admit: 2016-09-04 | Discharge: 2016-09-04 | Disposition: A | Discharge status: Home / Self Care | Payer: BC Managed Care – PPO | Attending: Emergency Medicine | Admitting: Emergency Medicine

## 2016-09-04 DIAGNOSIS — S99912A Unspecified injury of left ankle, initial encounter: Secondary | ICD-10-CM | POA: Diagnosis present

## 2016-09-04 DIAGNOSIS — S82235A Nondisplaced oblique fracture of shaft of left tibia, initial encounter for closed fracture: Secondary | ICD-10-CM | POA: Diagnosis not present

## 2016-09-04 DIAGNOSIS — Y929 Unspecified place or not applicable: Secondary | ICD-10-CM | POA: Diagnosis not present

## 2016-09-04 DIAGNOSIS — X509XXA Other and unspecified overexertion or strenuous movements or postures, initial encounter: Secondary | ICD-10-CM | POA: Insufficient documentation

## 2016-09-04 DIAGNOSIS — Y999 Unspecified external cause status: Secondary | ICD-10-CM | POA: Diagnosis not present

## 2016-09-04 DIAGNOSIS — Y93H2 Activity, gardening and landscaping: Secondary | ICD-10-CM | POA: Insufficient documentation

## 2016-09-04 DIAGNOSIS — Z79899 Other long term (current) drug therapy: Secondary | ICD-10-CM | POA: Insufficient documentation

## 2016-09-04 IMAGING — CT CT ANKLE*L* W/O CM
3 series · 12 of 33 positions shown, 14 images · non-contrast
Comparison: Radiograph [DATE]

CLINICAL DATA: Ankle fracture

EXAM:
CT OF THE LEFT ANKLE WITHOUT CONTRAST
TECHNIQUE: Multidetector CT imaging of the left ankle was performed according
to the standard protocol. Multiplanar CT image reconstructions were
also generated.

[Series 8: axial st · axial · 0.24mm/px · z∈[-547,-414]mm · 4 of 196 slices shown, 5 images]
[im 31/196  soft-tissue]
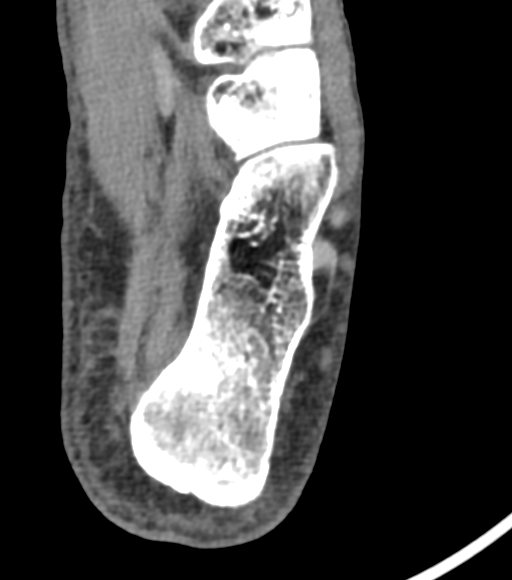
[im 31/196  bone]
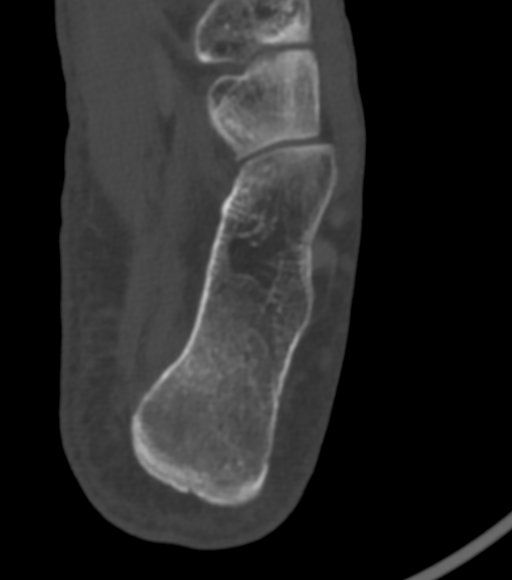
[im 76/196  bone]
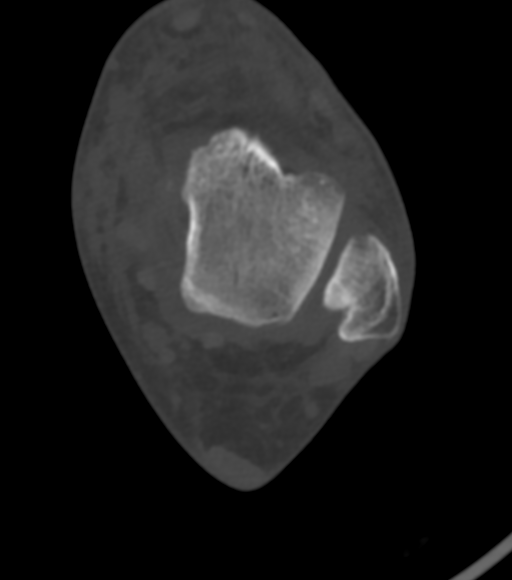
[im 121/196  bone]
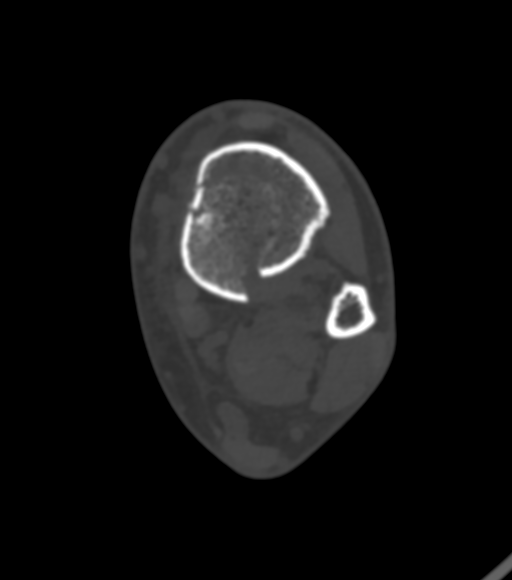
[im 166/196  bone]
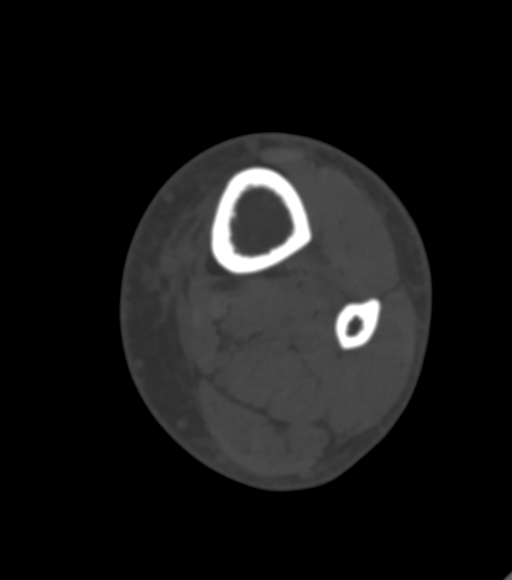

[Series 9: cor st · coronal · 0.24mm/px · 3 of 100 slices shown]
[im 20/100  bone]
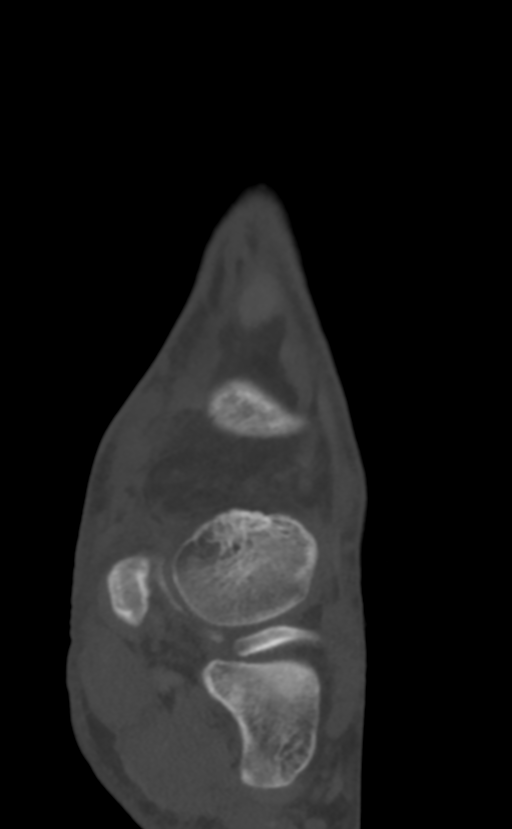
[im 40/100  bone]
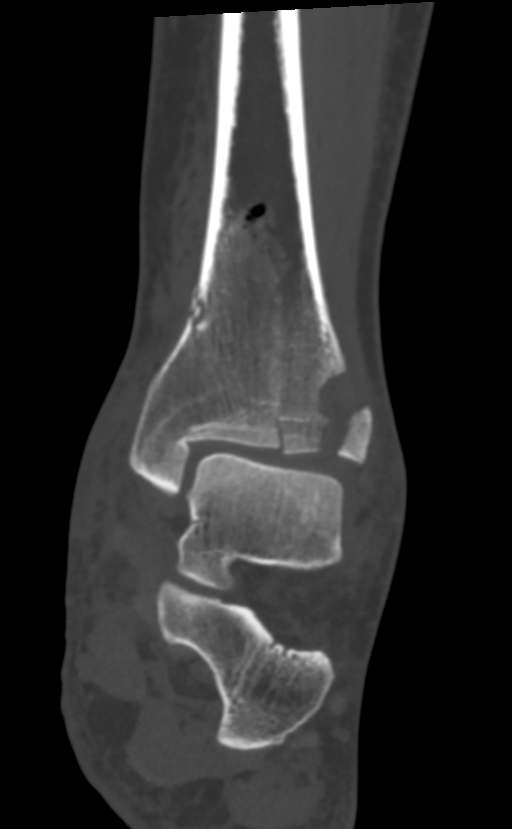
[im 60/100  bone]
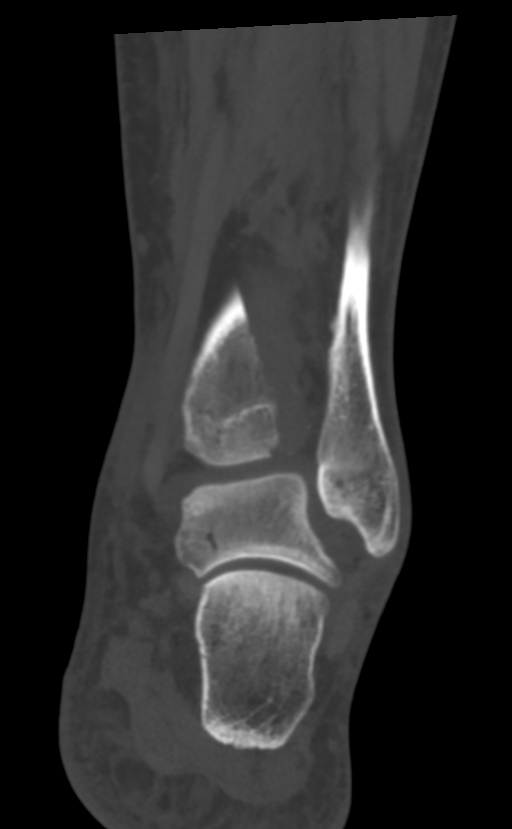

[Series 10: sag st · sagittal · 0.30mm/px · 5 of 76 slices shown, 6 images]
[im 26/76  bone]
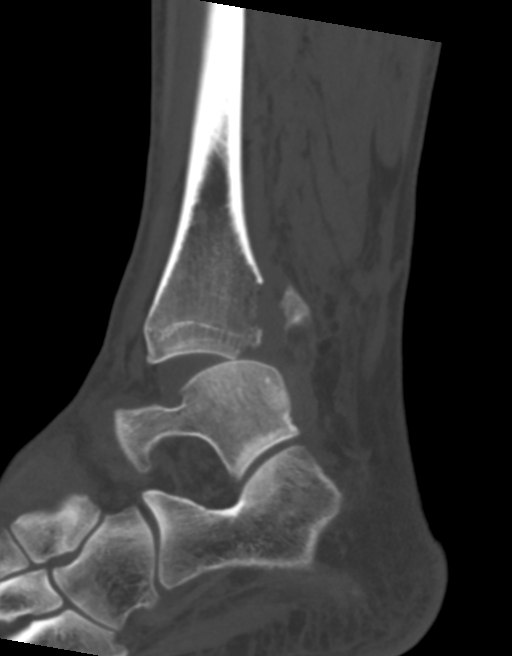
[im 32/76  bone]
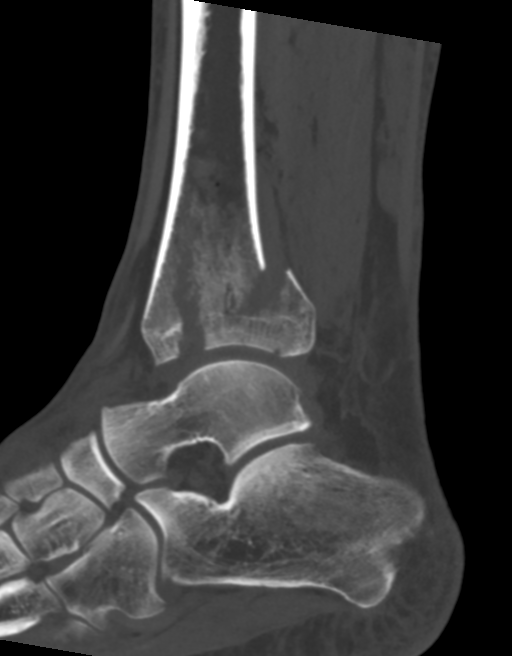
[im 38/76  soft-tissue]
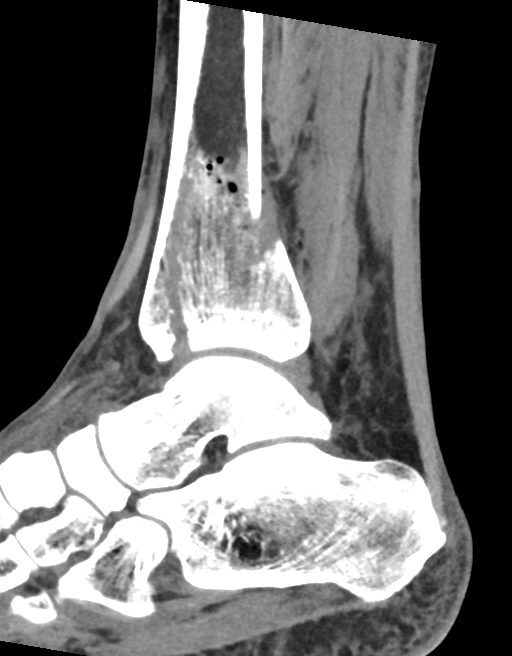
[im 38/76  bone]
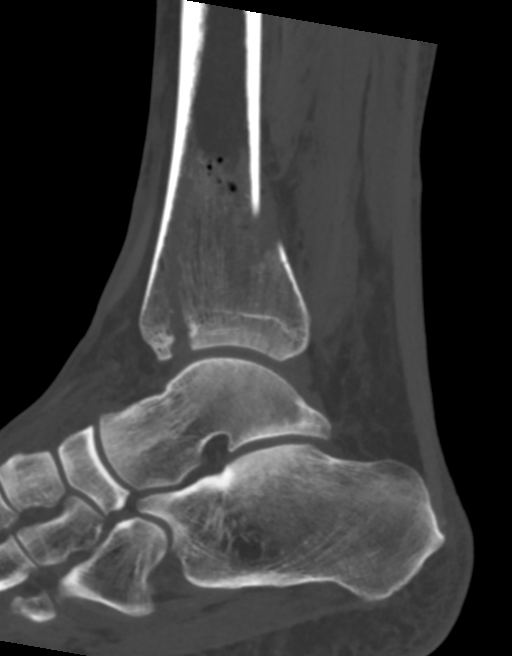
[im 44/76  bone]
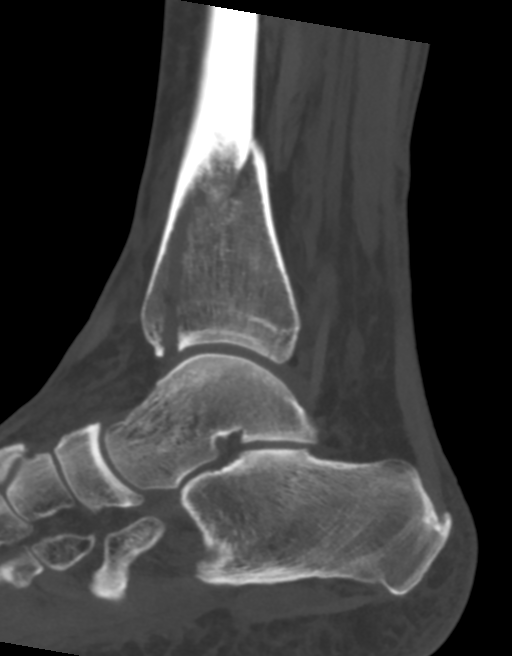
[im 51/76  bone]
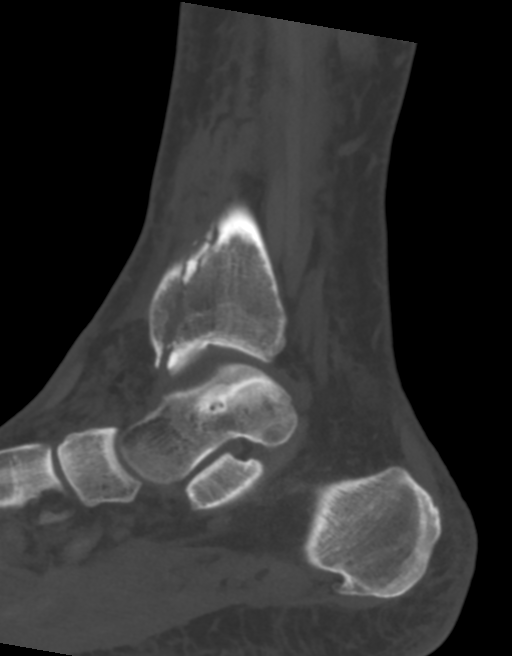

[12 of 33 positions shown; findings below may reference images not displayed]

FINDINGS: Bones/Joint/Cartilage

Comminuted fracture involving the distal shaft, metaphysis and
epiphysis of the tibia with intra articular extension laterally,
anteriorly and posteriorly. Distraction of the fracture fragments
with extension of fracture lucency to the anterior aspect of the
medial malleolus on the coronal views. Multiple bone fragment/loose
bodies within the superolateral joint space with disruption of the
lateral mortise. 17 mm displaced bone fragment anterior to the
distal fibula, off of the anterior, lateral aspect of the tibia.
Impacted fracture fragments at the lateral mortise. On the sagittal
reconstructions, there is fracture lucency through the posterior
malleolus of the distal tibia. Abnormal widening of the anterior
joint space on the lateral side of the ankle. There is no evidence
for a talus fracture. Bony spurring of the posterior and plantar
calcaneus. The distal fibula appears intact. Small gas bubbles in
central aspect of the distal tibial shaft.

Ligaments

Suboptimally assessed by CT.

Muscles and Tendons

Normal muscle bulk about the ankle. Medial and lateral tendons
grossly normal in position. Achilles tendon appears intact.

Soft tissues

Large amount of soft tissue swelling medially and laterally.
IMPRESSION: 1. Comminuted distal tibial fracture with distraction of distal
fracture fragments. Impacted fracture fragments along the lateral
mortise with small loose body fragments within the superior, lateral
joint space. There is a 17 mm posteriorly and laterally displaced
bone fragment from the anterolateral aspect of the distal tibia.
Fracture involvement of the posterior malleolus of the distal tibia.
Distortion of the superolateral mortise with abnormal widening of
the anterior, lateral aspect of the ankle joint.
2. Apparent tiny gas bubbles within the distal shaft of the tibia

## 2016-09-04 IMAGING — DX DG ANKLE COMPLETE 3+V*L*
3 series · 3 of 3 positions shown · non-contrast
Comparison: None.

ADDENDUM:
2 cm laterally displaced bone fragment between the distal fibula and
tibia. Mild asymmetric widening of the anterior joint space.
CLINICAL DATA: Fall with ankle pain and swelling

EXAM:
LEFT ANKLE COMPLETE - 3+ VIEW

[ankle ap]
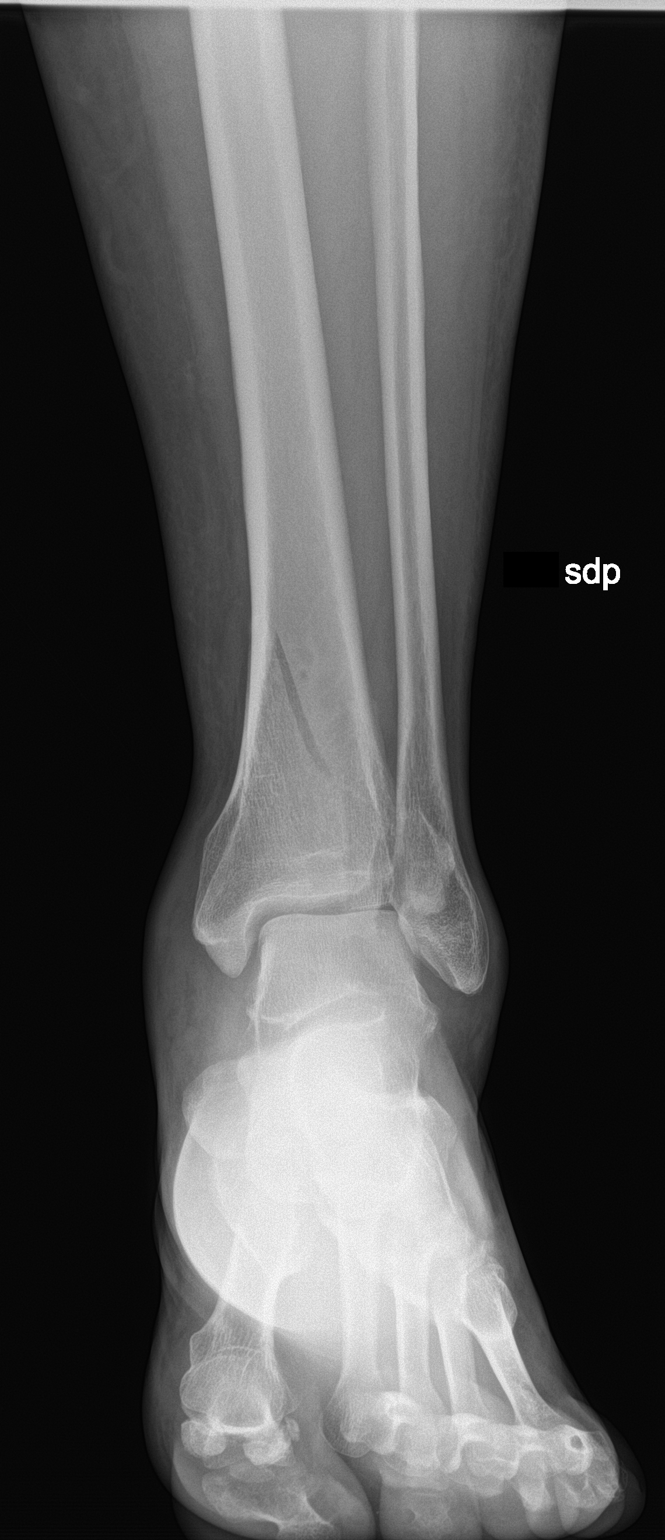

[ankle obl]
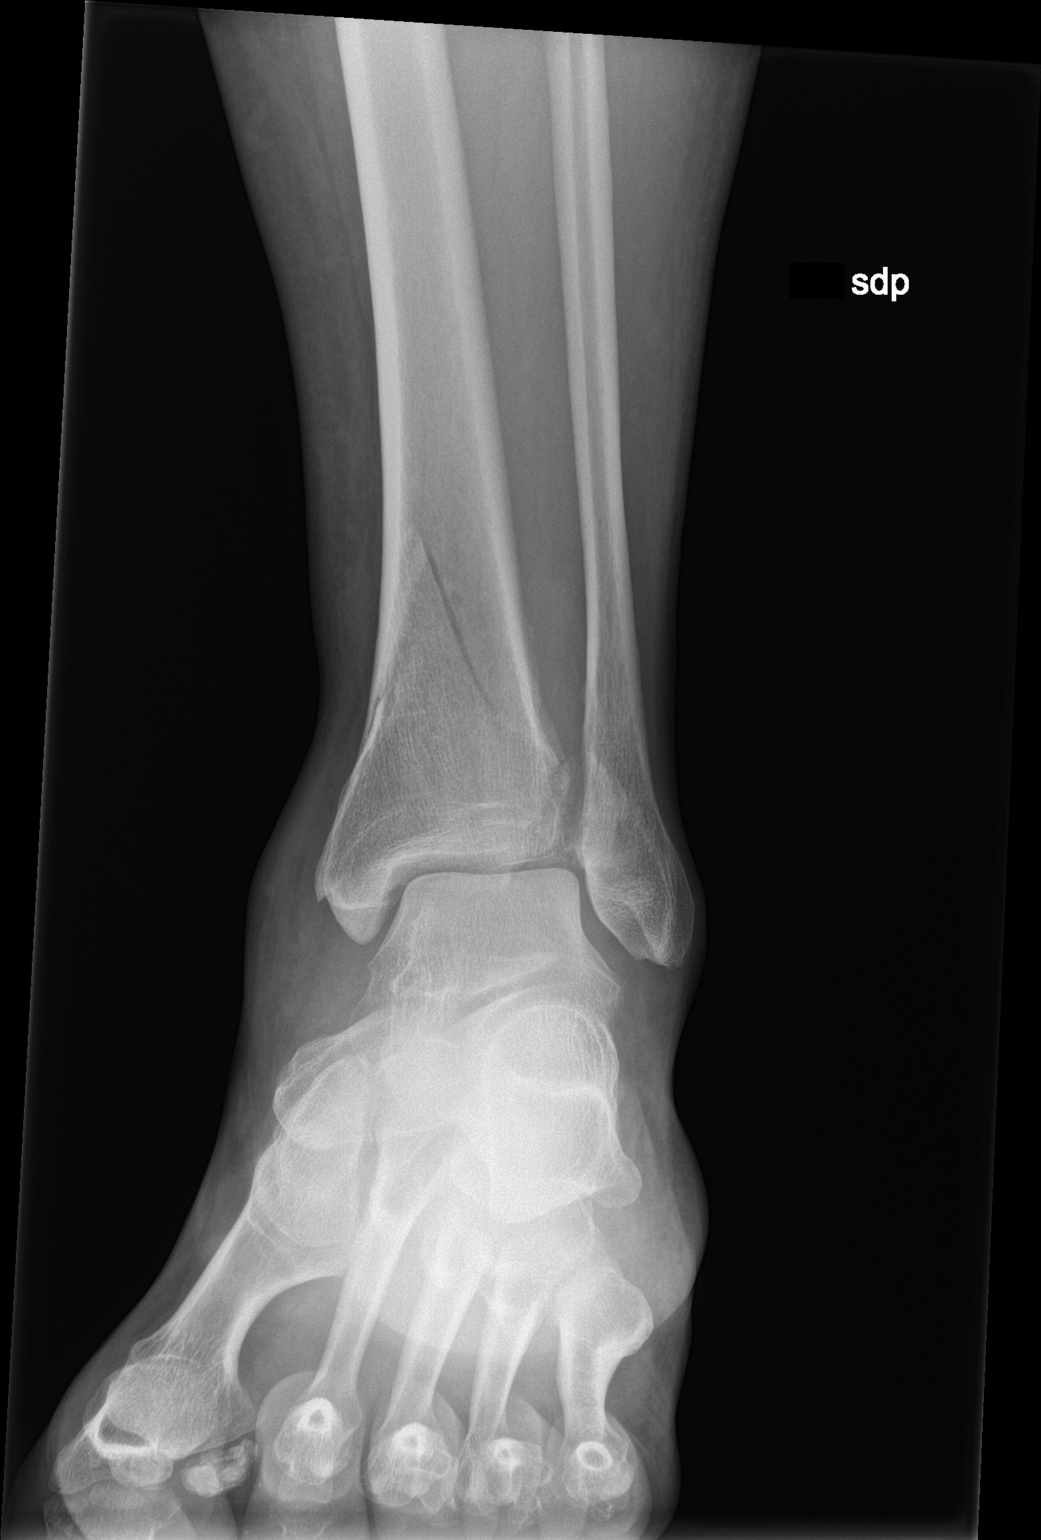

[ankle lat]
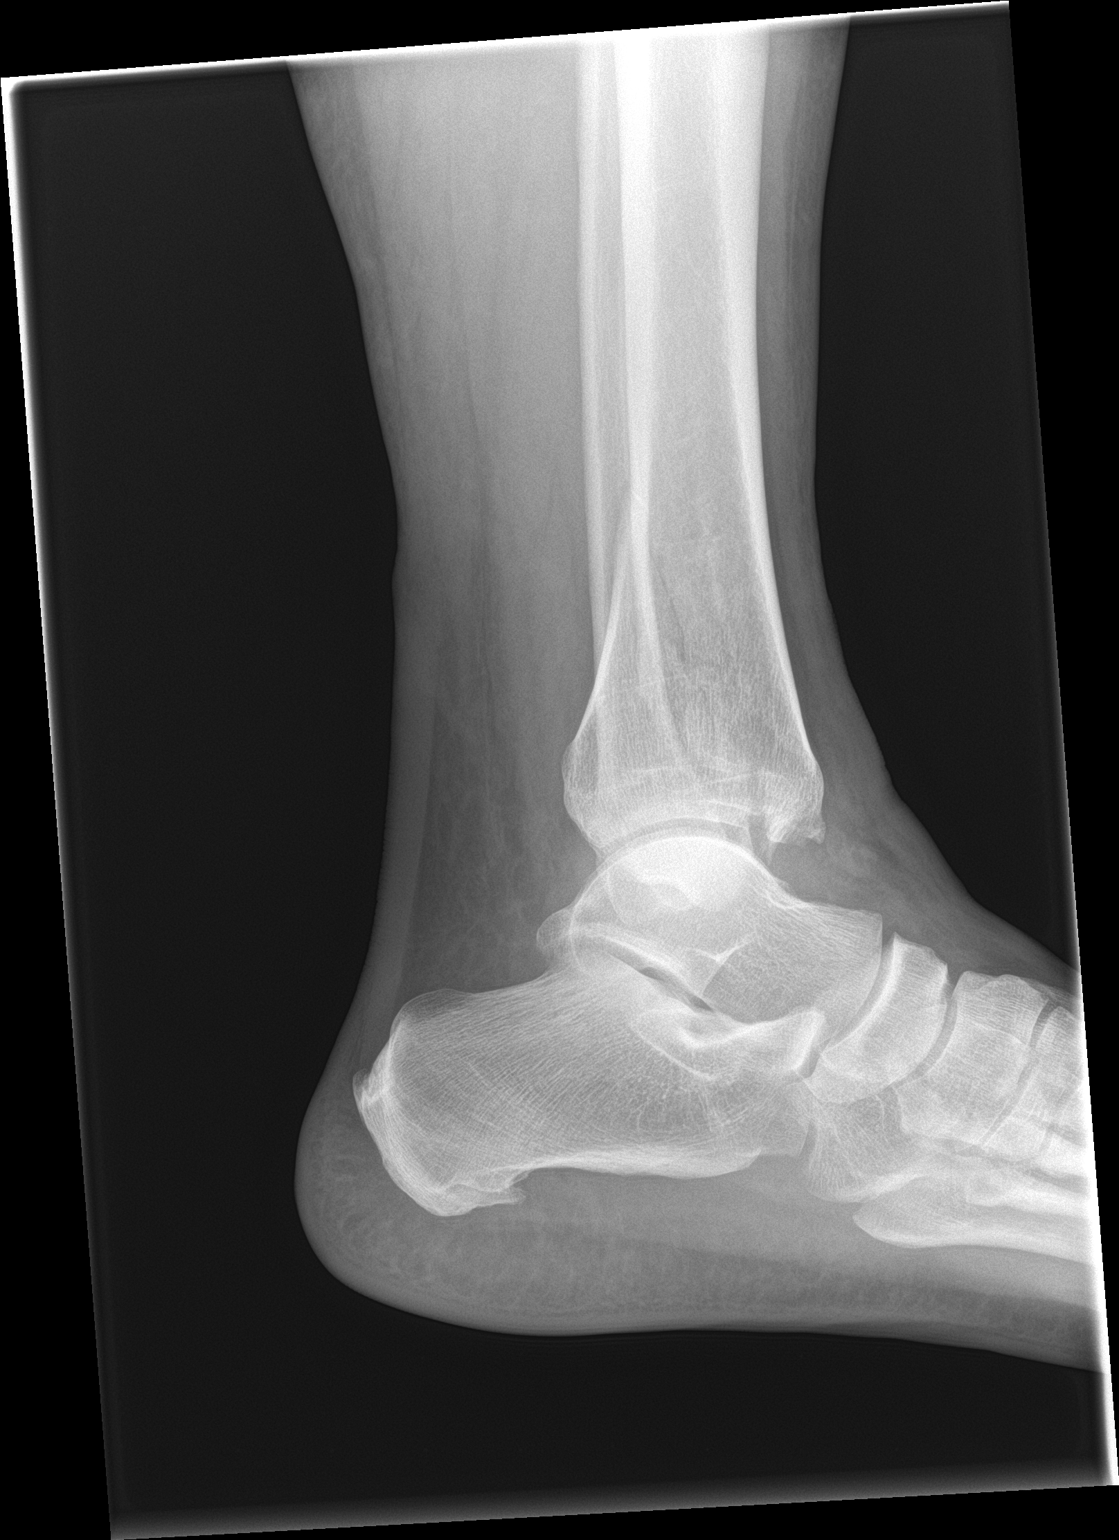

[3 of 3 positions shown; findings below may reference images not displayed]

FINDINGS: Nondisplaced fracture involving the distal tibial metaphysis. There
is anterior intra-articular extension of fracture lucency. Ankle
mortise grossly symmetric. Soft tissue swelling is present. Small
moderate plantar calcaneal spur.
IMPRESSION: Nondisplaced fracture of the distal tibia with intra-articular
extension anteriorly.

## 2016-09-04 MED ORDER — HYDROMORPHONE HCL 1 MG/ML IJ SOLN
1.0000 mg | Freq: Once | INTRAMUSCULAR | Status: AC
  Administered 2016-09-04: 1 mg via INTRAMUSCULAR

## 2016-09-04 MED ORDER — OXYCODONE-ACETAMINOPHEN 5-325 MG PO TABS
1.0000 | ORAL_TABLET | Freq: Once | ORAL | Status: AC
  Administered 2016-09-04: 1 via ORAL
  Filled 2016-09-04: qty 1

## 2016-09-04 MED ORDER — HYDROMORPHONE HCL 1 MG/ML IJ SOLN
INTRAMUSCULAR | Status: AC
  Filled 2016-09-04: qty 1

## 2016-09-04 MED ORDER — OXYCODONE-ACETAMINOPHEN 10-325 MG PO TABS: 1.0000 | ORAL_TABLET | Freq: Three times a day (TID) | ORAL | 0 refills | Status: AC | PRN

## 2016-09-04 NOTE — ED Notes (Signed)
Patient reports left ankle pain. Does have swelling to ankle. Patient has ankle flex now, if he tries to relax it he has pain and a "popping" sensation.

## 2016-09-04 NOTE — ED Triage Notes (Signed)
Pt states he was mowing his grass and slipped and fell, states left ankle pain

## 2016-09-04 NOTE — ED Notes (Signed)
Pt transported to CT ?

## 2016-09-04 NOTE — ED Provider Notes (Signed)
[facility] Emergency Department Provider Note ____________________________________________  Time seen: 1846  I have reviewed the triage vital signs and the nursing notes.  HISTORY  Chief Complaint  Ankle Pain  HPI Alex Patel is a 51 y.o. male presents to the ED for evaluationof left ankle pain. Patient fell after rolling his ankle while cutting the grass earlier today. He noted immediate pain to the ankle as well as some intermittent nausea. He is unable to bear weight following the injury. He presents now with ankle pain, swelling, and disability. He denies any other injury at this time.  History reviewed. No pertinent past medical history.  Patient Active Problem List   Diagnosis Date Noted  . Superficial thrombophlebitis 02/13/2016  . Varicose veins of leg with pain, right 02/13/2016    Past Surgical History:  Procedure Laterality Date  . APPENDECTOMY    . GASTRIC BYPASS    . LASER ABLATION Bilateral     Prior to Admission medications   Medication Sig Start Date End Date Taking? Authorizing Provider  ALPRAZolam Prudy Feeler) 0.5 MG tablet  07/30/16   [provider]  esomeprazole (NEXIUM) 40 MG capsule Take 40 mg by mouth.    [provider]  Multiple Vitamin (MULTI-VITAMINS) TABS Take by mouth.    [provider]  oxyCODONE-acetaminophen (PERCOCET) 10-325 MG tablet Take 1 tablet by mouth every 8 (eight) hours as needed for pain. 09/04/16 09/11/16  Briannie Gutierrez, Charlesetta Ivory, PA-C  SUMAtriptan (IMITREX) 100 MG tablet take 1 tablet by mouth immediately may repeat after 2 hours if needed 05/15/15   [provider]  SUMAtriptan 6 MG/0.5ML SOAJ INJECT 1 DOSE UNDER THE SKIN FOR MIGRAINES. MAY REPEAT IN 1 HOUR IF MIGRAINE PERSISTS-MAX OF 18 07/09/16   [provider]  traZODone (DESYREL) 100 MG tablet take 1 tablet by mouth at bedtime 01/27/14   [provider]  verapamil (CALAN-SR) 240 MG CR tablet Take by mouth.     [provider]    Allergies Patient has no known allergies.  Family History  Problem Relation Age of Onset  . Hypertension Mother   . Varicose Veins Father   . Cancer Maternal Grandmother   . Diabetes Maternal Grandmother   . Varicose Veins Paternal Grandmother     Social History Social History  Substance Use Topics  . Smoking status: Never Smoker  . Smokeless tobacco: Never Used  . Alcohol use No    Review of Systems  Constitutional: Negative for fever. Cardiovascular: Negative for chest pain. Respiratory: Negative for shortness of breath. Musculoskeletal: Negative for back pain. Left ankle pain & disability Skin: Negative for rash. Neurological: Negative for headaches, focal weakness or numbness. ____________________________________________  PHYSICAL EXAM:  VITAL SIGNS: ED Triage Vitals  Enc Vitals Group     BP 09/04/16 1811 (!) 99/59     Pulse Rate 09/04/16 1811 (!) 105     Resp 09/04/16 1811 18     Temp 09/04/16 1811 98.1 F (36.7 C)     Temp Source 09/04/16 1811 Oral     SpO2 09/04/16 1811 98 %     Weight 09/04/16 1809 280 lb (127 kg)     Height 09/04/16 1809 6\' 1"  (1.854 m)     Head Circumference --      Peak Flow --      Pain Score 09/04/16 1809 10     Pain Loc --      Pain Edu? --      Excl. in  GC? --     Constitutional: Alert and oriented. Well appearing and in no distress. Head: Normocephalic and atraumatic. Cardiovascular: Normal rate, regular rhythm. Normal distal pulsesAnd cap refill.Marland Kitchen. Respiratory: Normal respiratory effort. No wheezes/rales/rhonchi. Musculoskeletal: Left ankle with obvious soft tissue swelling noted medially and laterally. Patient is noted to have tenderness to palpation over the medial and lateral aspects of the ankle malleoli. No Achilles or calf tenderness noted. Nontender with normal range of motion in all extremities.  Neurologic: Normal gross sensation. No gross focal neurologic deficits are appreciated. Skin:   Skin is warm, dry and intact. No rash noted. ____________________________________________   RADIOLOGY  Right Ankle   IMPRESSION: Nondisplaced fracture of the distal tibia with intra-articular extension anteriorly.  CT Left Ankle IMPRESSION: 1. Comminuted distal tibial fracture with distraction of distal fracture fragments. Impacted fracture fragments along the lateral mortise with small loose body fragments within the superior, lateral joint space. There is a 17 mm posteriorly and laterally displaced bone fragment from the anterolateral aspect of the distal tibia. Fracture involvement of the posterior malleolus of the distal tibia. Distortion of the superolateral mortise with abnormal widening of the anterior, lateral aspect of the ankle joint. 2. Apparent tiny gas bubbles within the distal shaft of the tibia  I, Keywon Mestre, Charlesetta IvoryJenise V Bacon, personally viewed and evaluated these images (plain radiographs) as part of my medical decision making, as well as reviewing the written report by the radiologist. ____________________________________________  PROCEDURES  Percocet 5-325 mg PO x 1 Dilaudid 1 mg IM OCL Short leg splint Crutches ____________________________________________  INITIAL IMPRESSION / ASSESSMENT AND PLAN / ED COURSE  ----------------------------------------- 7:31 PM on 09/04/2016 -----------------------------------------  Spoke with Dr.Menz. He reviewed the images and wants a CT scan to further evaluate joint involement for possible surgery management. He will otherwise see the patient in the office on Monday.  Patient with initial fracture management of a closed, minimally displaced oblique fracture of the distal tibial shaft. He is fitted with an appropriate OCL splint and given crutches for nonweightbearing gait. He will follow with Dr. Rosita KeaMenz early Monday morning for possible surgical intervention by Monday afternoon. Patient is discharged with a prescription for  Percocet for pain and will rest with the foot elevated and apply ice to reduce swelling. Return precautions were reviewed. ____________________________________________  FINAL CLINICAL IMPRESSION(S) / ED DIAGNOSES  Final diagnoses:  Closed nondisplaced oblique fracture of shaft of left tibia, initial encounter      Lissa HoardMenshew, Aydrian Halpin V Bacon, PA-C 09/04/16 2117    Loleta RoseForbach, Cory, MD 09/04/16 2337

## 2016-09-04 NOTE — Discharge Instructions (Signed)
You have a fracture (break) to the shin bone at the ankle. You MUST remain in the splint and non-weightbearing until you are evaluated by ortho on Monday. Take the prescription pain medicine as directed. You may also take ibuprofen for non-drowsy pain and inflammation relief. Rest with the leg elevated and apply ice packs over the splint to reduce swelling. Good luck!

## 2021-02-07 ENCOUNTER — Observation Stay
Admission: EM | Admit: 2021-02-07 | Discharge: 2021-02-08 | Disposition: A | Discharge status: Home / Self Care | Payer: BC Managed Care – PPO | Attending: Student | Admitting: Student

## 2021-02-07 ENCOUNTER — Encounter: Payer: Self-pay | Admitting: Radiology

## 2021-02-07 ENCOUNTER — Emergency Department: Payer: BC Managed Care – PPO

## 2021-02-07 ENCOUNTER — Other Ambulatory Visit: Payer: Self-pay

## 2021-02-07 DIAGNOSIS — R519 Headache, unspecified: Secondary | ICD-10-CM | POA: Insufficient documentation

## 2021-02-07 DIAGNOSIS — E876 Hypokalemia: Secondary | ICD-10-CM | POA: Diagnosis not present

## 2021-02-07 DIAGNOSIS — Z20822 Contact with and (suspected) exposure to covid-19: Secondary | ICD-10-CM | POA: Diagnosis not present

## 2021-02-07 DIAGNOSIS — G43519 Persistent migraine aura without cerebral infarction, intractable, without status migrainosus: Secondary | ICD-10-CM | POA: Diagnosis not present

## 2021-02-07 DIAGNOSIS — R0789 Other chest pain: Principal | ICD-10-CM | POA: Insufficient documentation

## 2021-02-07 DIAGNOSIS — R778 Other specified abnormalities of plasma proteins: Secondary | ICD-10-CM | POA: Diagnosis present

## 2021-02-07 DIAGNOSIS — K219 Gastro-esophageal reflux disease without esophagitis: Secondary | ICD-10-CM

## 2021-02-07 DIAGNOSIS — R7989 Other specified abnormal findings of blood chemistry: Secondary | ICD-10-CM | POA: Diagnosis present

## 2021-02-07 DIAGNOSIS — R079 Chest pain, unspecified: Secondary | ICD-10-CM

## 2021-02-07 LAB — BASIC METABOLIC PANEL
Anion gap: 3 — ABNORMAL LOW (ref 5–15)
BUN: 17 mg/dL (ref 6–20)
CO2: 27 mmol/L (ref 22–32)
Calcium: 8.7 mg/dL — ABNORMAL LOW (ref 8.9–10.3)
Chloride: 109 mmol/L (ref 98–111)
Creatinine, Ser: 0.87 mg/dL (ref 0.61–1.24)
GFR, Estimated: >60 mL/min (ref >60)
Glucose, Bld: 85 mg/dL (ref 70–99)
Potassium: 3.5 mmol/L (ref 3.5–5.1)
Sodium: 139 mmol/L (ref 135–145)

## 2021-02-07 LAB — CBC
HCT: 35.4 % — ABNORMAL LOW (ref 39.0–52.0)
Hemoglobin: 10.4 g/dL — ABNORMAL LOW (ref 13.0–17.0)
MCH: 22.2 pg — ABNORMAL LOW (ref 26.0–34.0)
MCHC: 29.4 g/dL — ABNORMAL LOW (ref 30.0–36.0)
MCV: 75.5 fL — ABNORMAL LOW (ref 80.0–100.0)
Platelets: 252 10*3/uL (ref 150–400)
RBC: 4.69 MIL/uL (ref 4.22–5.81)
RDW: 16.8 % — ABNORMAL HIGH (ref 11.5–15.5)
WBC: 8.2 10*3/uL (ref 4.0–10.5)
nRBC: 0 % (ref 0.0–0.2)

## 2021-02-07 LAB — RESP PANEL BY RT-PCR (FLU A&B, COVID) ARPGX2
Influenza A by PCR: NEGATIVE
Influenza B by PCR: NEGATIVE
SARS Coronavirus 2 by RT PCR: NEGATIVE

## 2021-02-07 LAB — TROPONIN I (HIGH SENSITIVITY)
Troponin I (High Sensitivity): 44 ng/L — ABNORMAL HIGH (ref <18)
Troponin I (High Sensitivity): 36 ng/L — ABNORMAL HIGH (ref <18)

## 2021-02-07 IMAGING — CR DG CHEST 2V
1 series · 2 of 2 positions shown · non-contrast
Comparison: No priors.

CLINICAL DATA: 55-year-old male with history of chest pain.

EXAM:
CHEST - 2 VIEW

[Series 1: dg chest 2 view · 0.14mm/px · 2 of 2 slices shown]
[im 1/2]
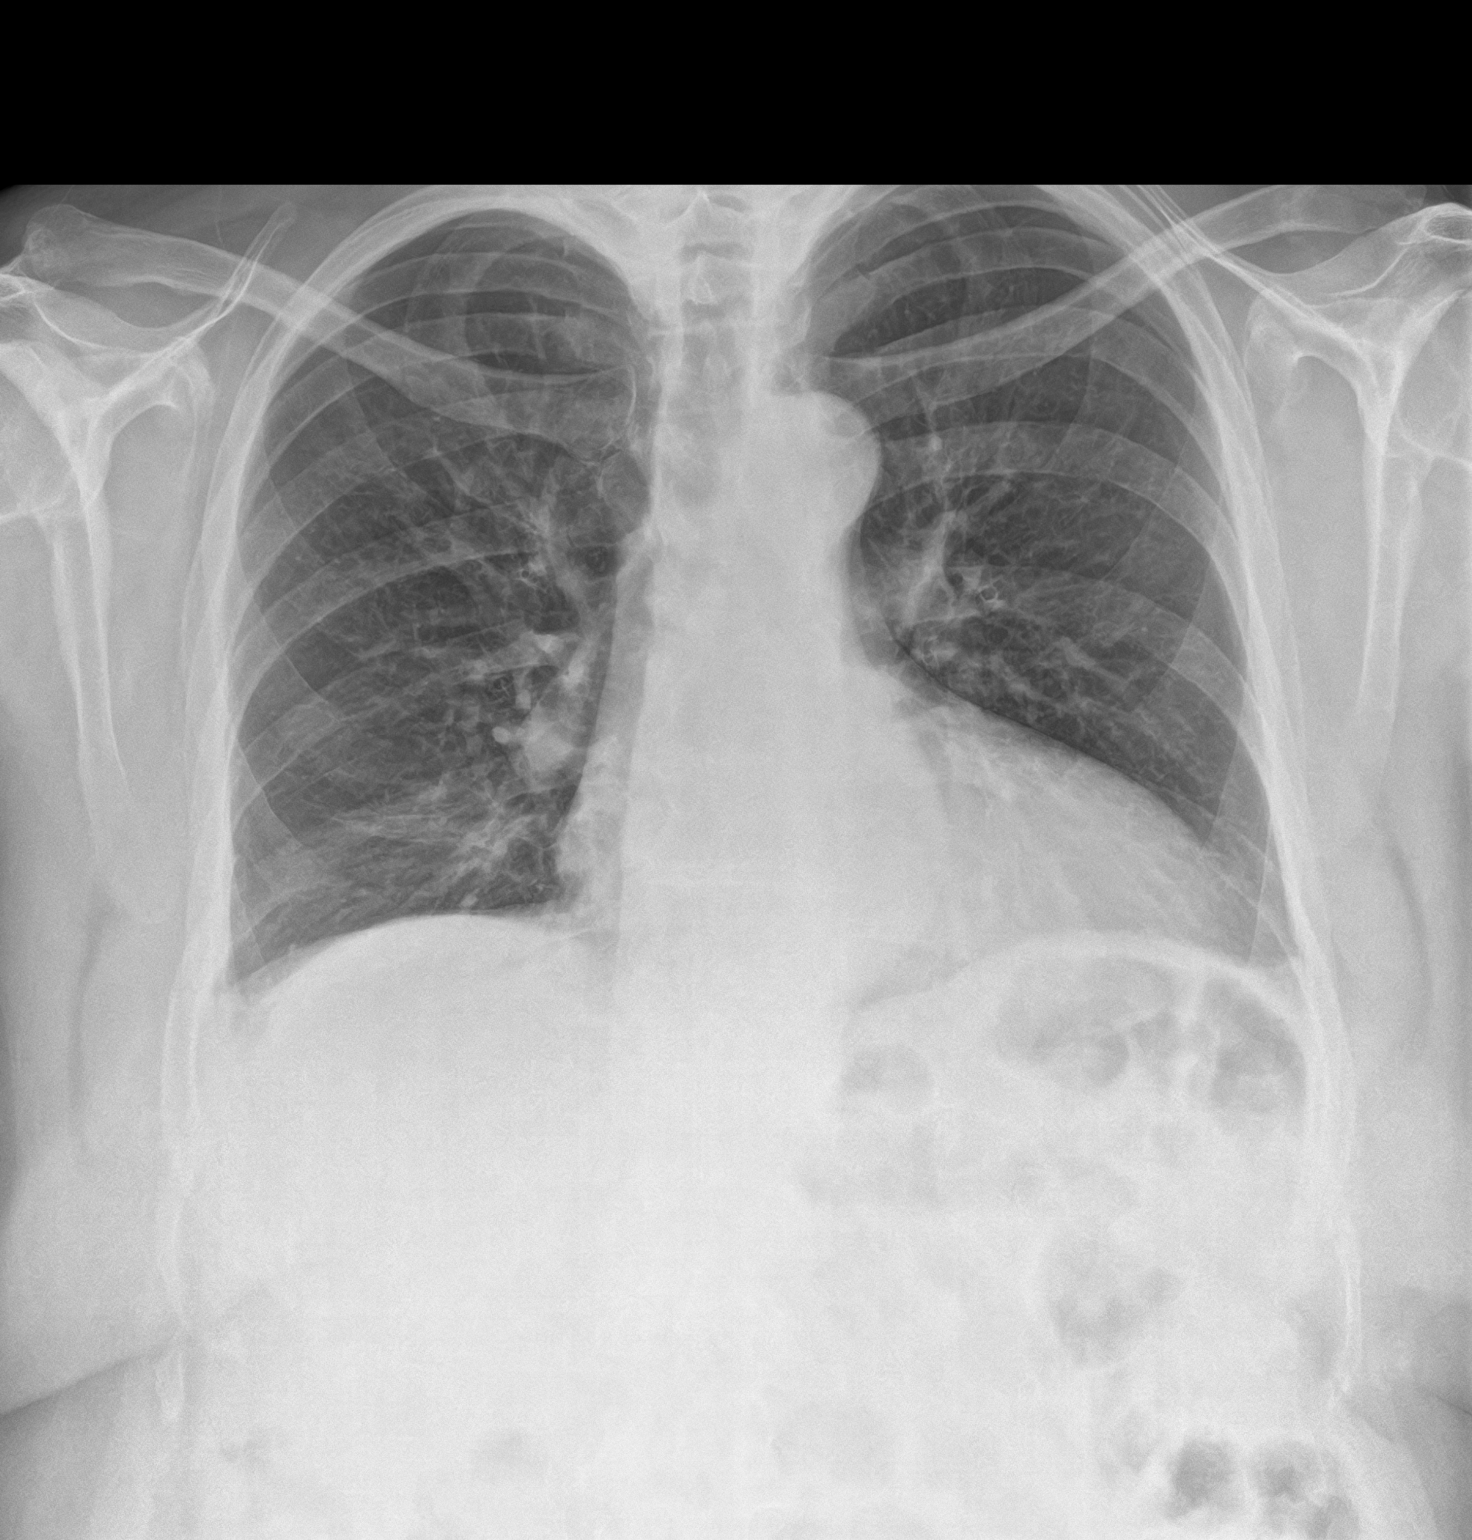
[im 2/2]
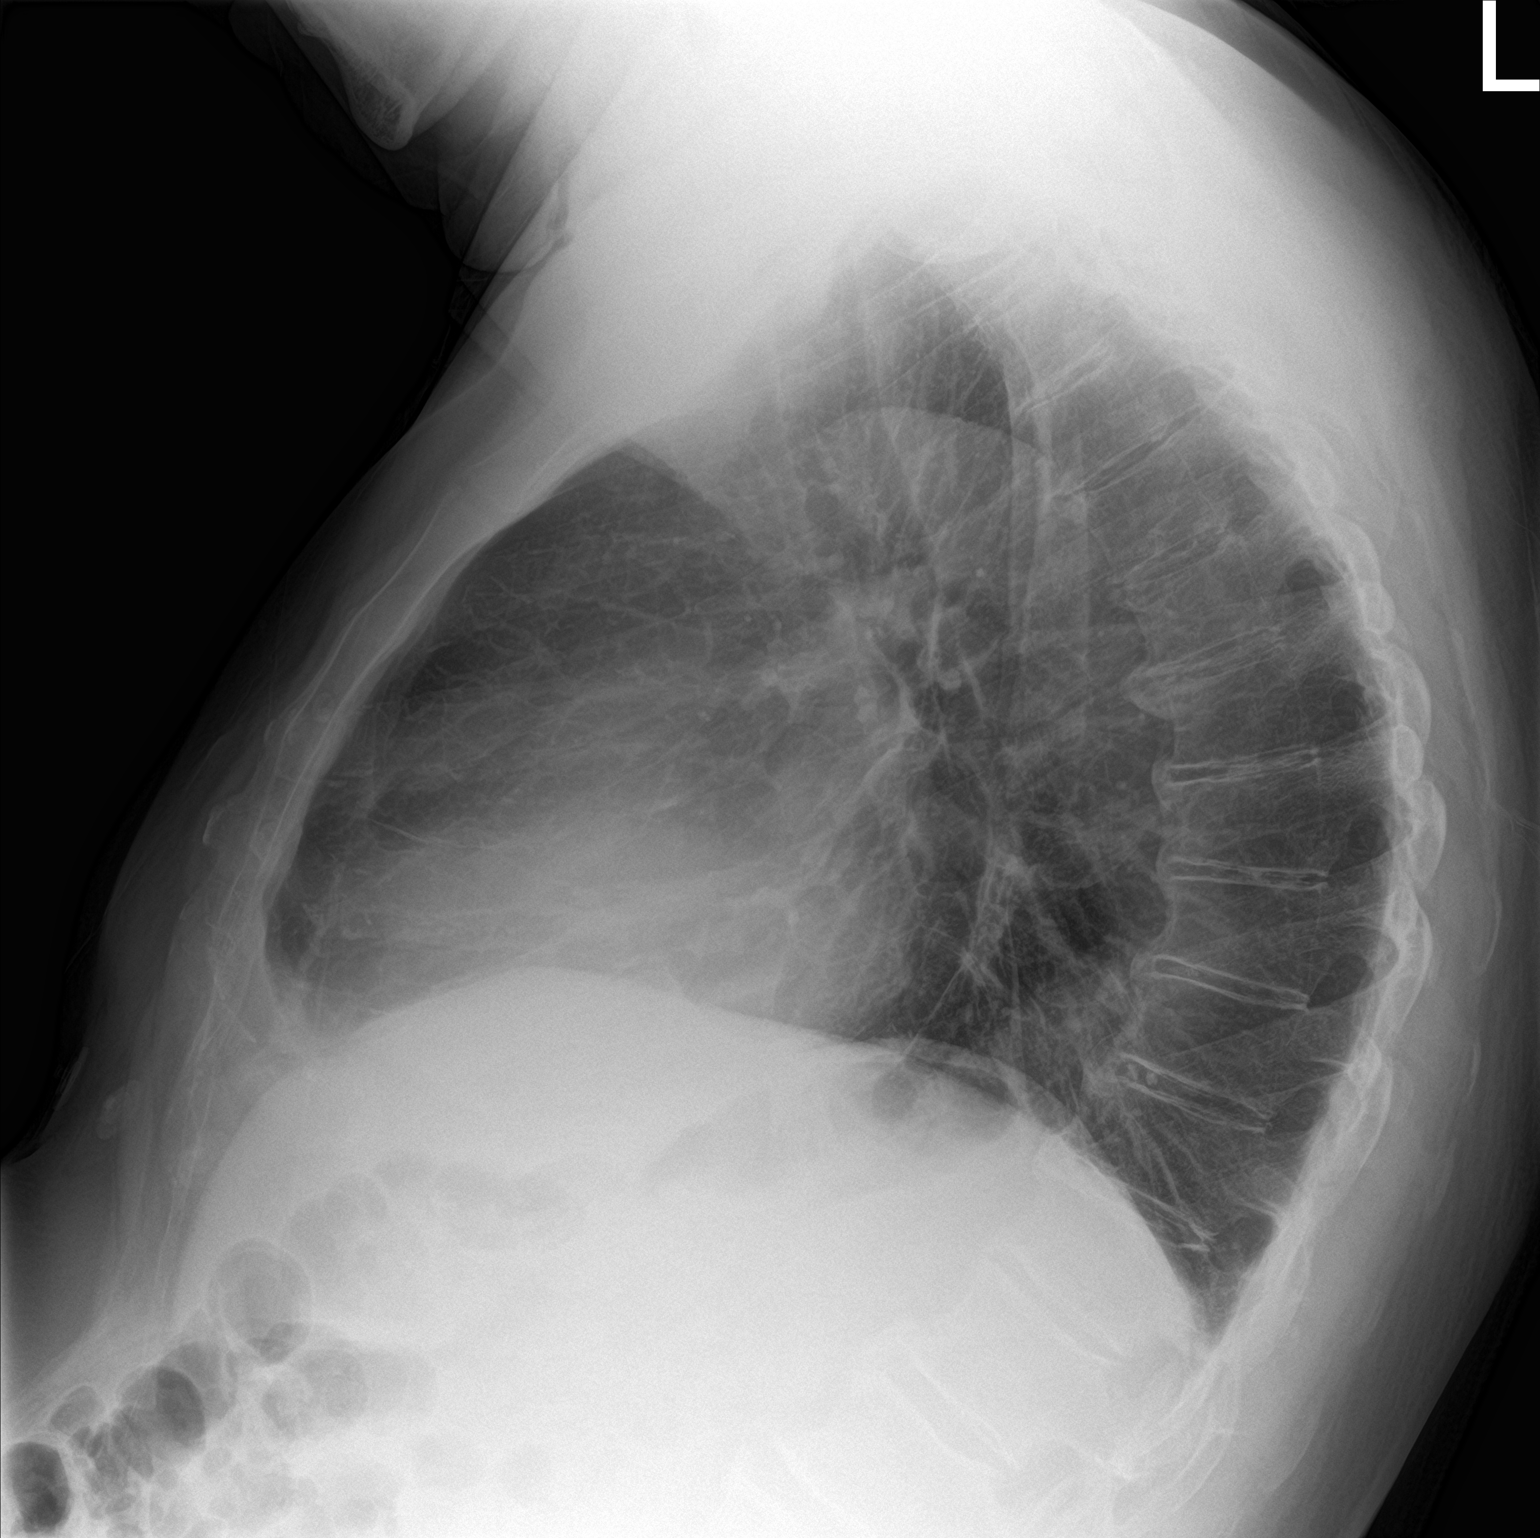

[2 of 2 positions shown; findings below may reference images not displayed]

FINDINGS: Lung volumes are low. No consolidative airspace disease. No pleural
effusions. No pneumothorax. No pulmonary nodule or mass noted.
Pulmonary vasculature and the cardiomediastinal silhouette are
within normal limits.
IMPRESSION: 1. Low lung volumes without radiographic evidence of acute
cardiopulmonary disease.

## 2021-02-07 IMAGING — CT CT ANGIO CHEST
4 of 7 series · 17 of 46 positions shown · IV contrast (omnipaque)
Comparison: No priors.

CLINICAL DATA: 55-year-old male with history of chest and back
pain. Headache. Evaluate for aortic dissection.

EXAM:
CT ANGIOGRAPHY CHEST WITH CONTRAST
TECHNIQUE: Multidetector CT imaging of the chest was performed using the
standard protocol during bolus administration of intravenous
contrast. Multiplanar CT image reconstructions and MIPs were
obtained to evaluate the vascular anatomy.
CONTRAST:  100mL OMNIPAQUE IOHEXOL 350 MG/ML SOLN

[Series 3: axial pre · axial · non-contrast · 0.80mm/px · z∈[+14,+214]mm · 5 of 62 slices shown]
[im 11/62  lung]
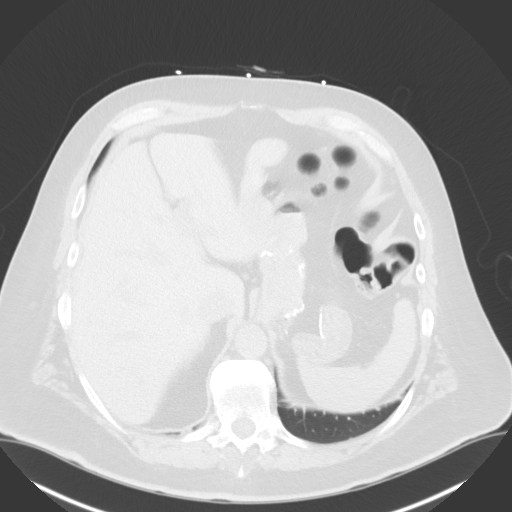
[im 21/62  soft-tissue]
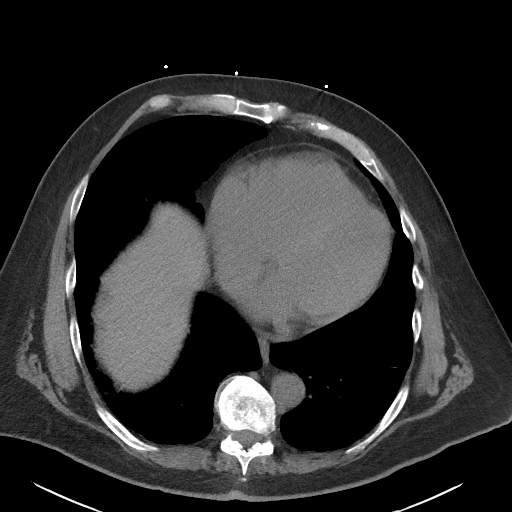
[im 31/62  lung]
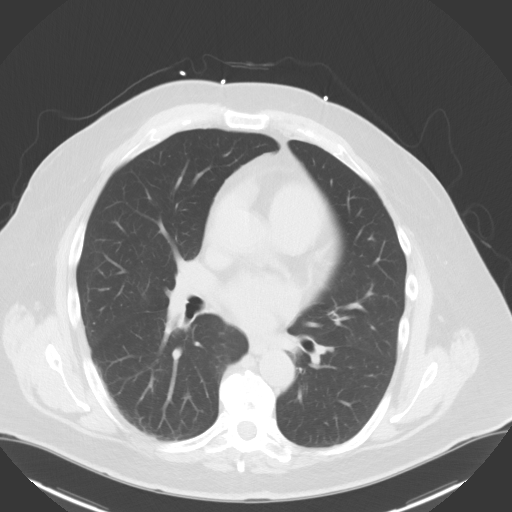
[im 41/62  soft-tissue]
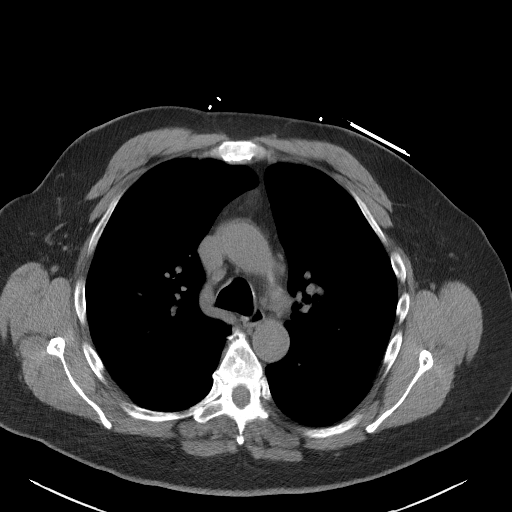
[im 51/62  lung]
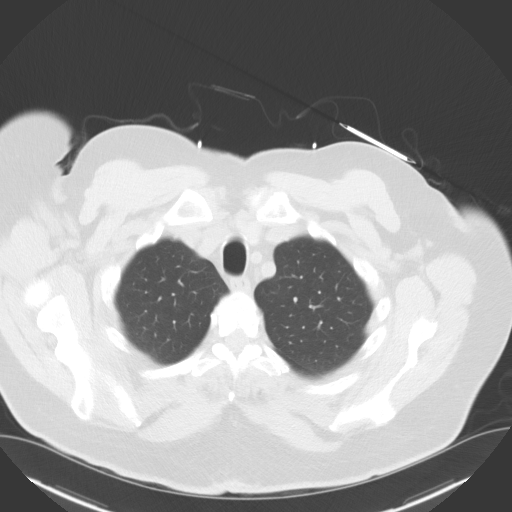

[Series 5: axial arterial · axial · arterial · 0.90mm/px · z∈[+5,+236]mm · 8 of 99 slices shown]
[im 11/99  lung]
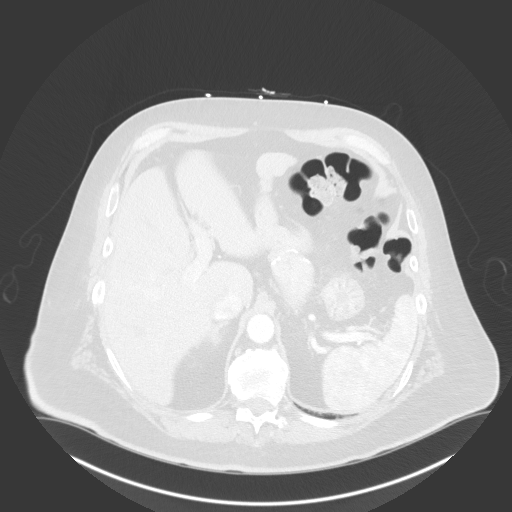
[im 22/99  lung]
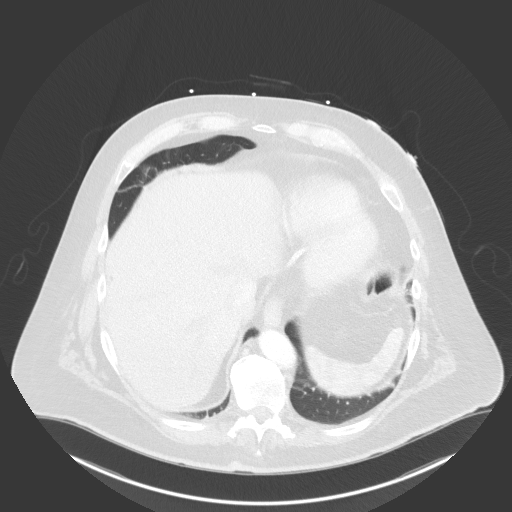
[im 33/99  lung]
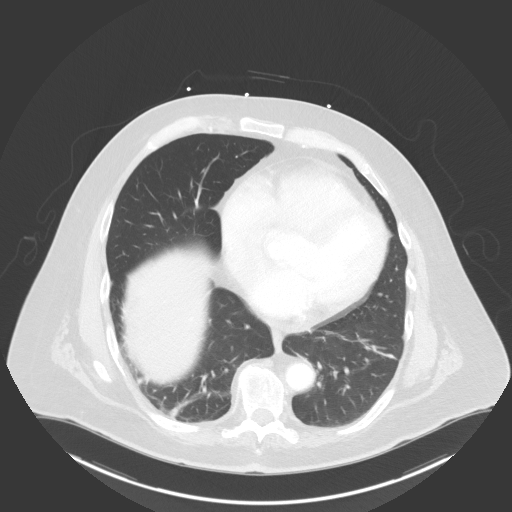
[im 44/99  lung]
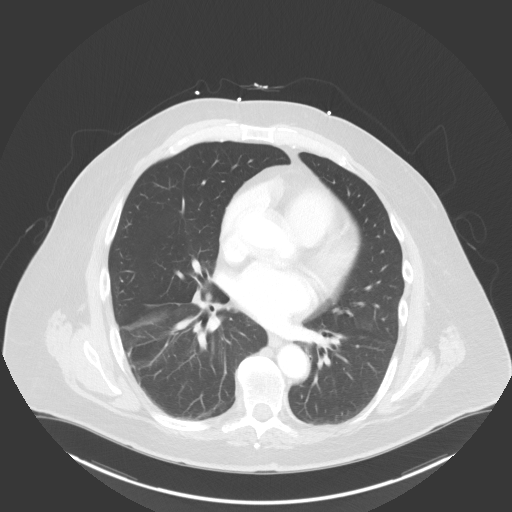
[im 55/99  lung]
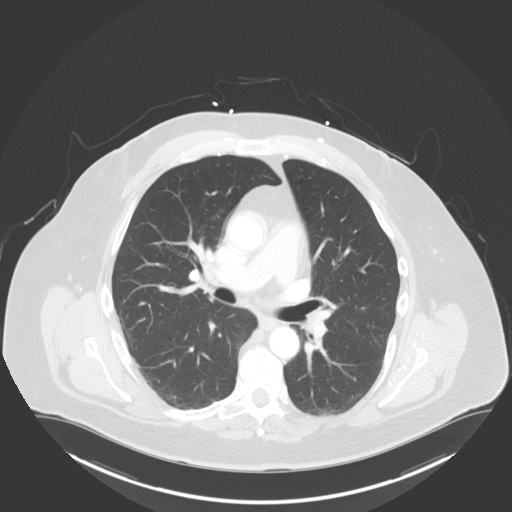
[im 66/99  lung]
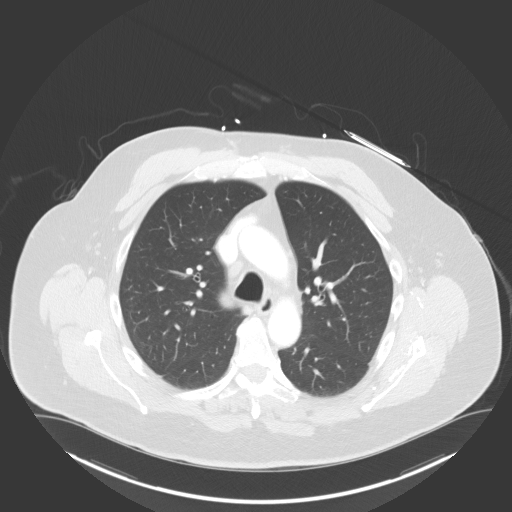
[im 77/99  lung]
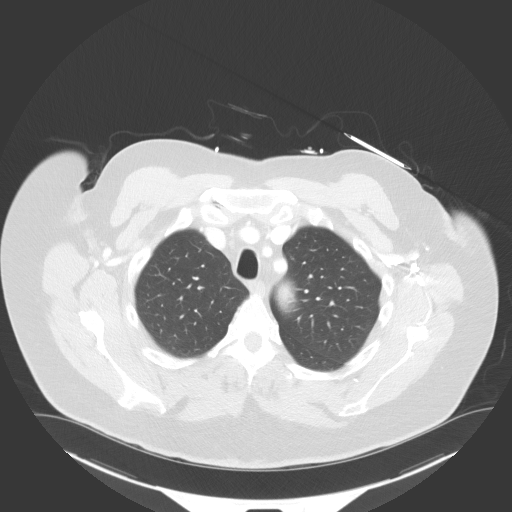
[im 88/99  lung]
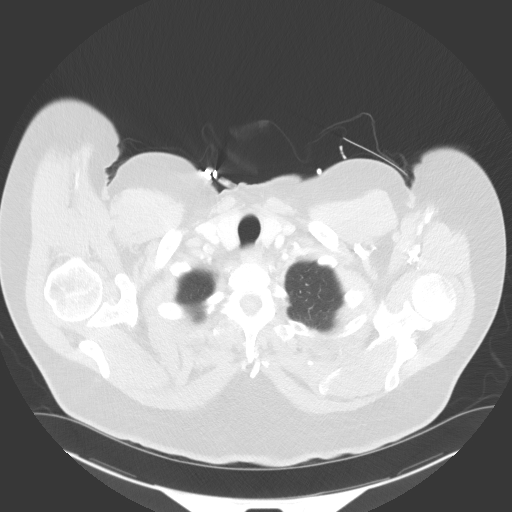

[Series 6: lung · axial · 0.90mm/px · 1 of 148 slices shown]
[im 11/148  soft-tissue]
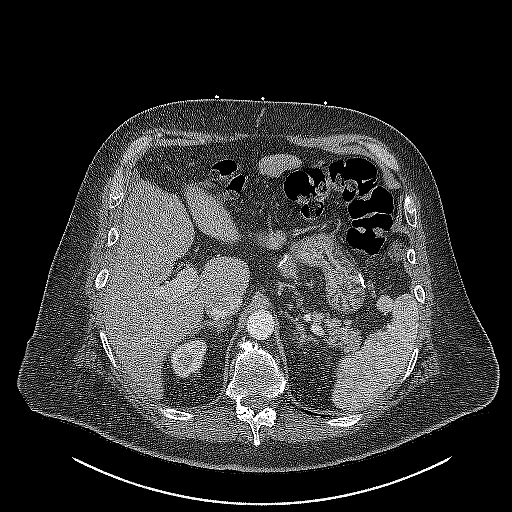

[Series 8: coronals · coronal · 0.62mm/px · 3 of 167 slices shown]
[im 42/167  soft-tissue]
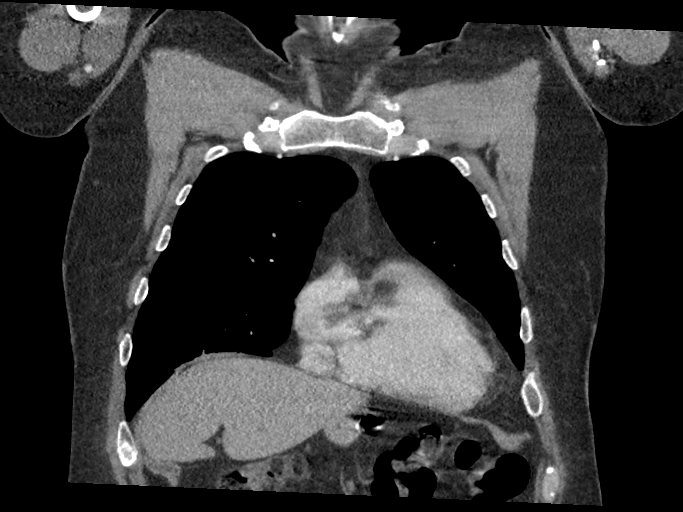
[im 84/167  soft-tissue]
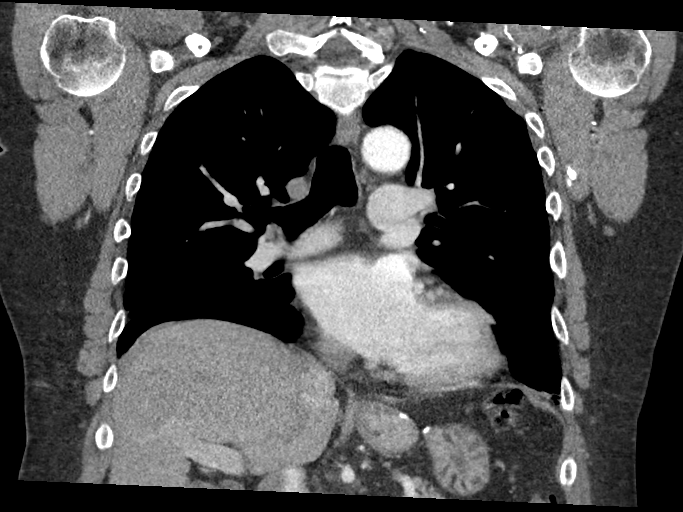
[im 125/167  soft-tissue]
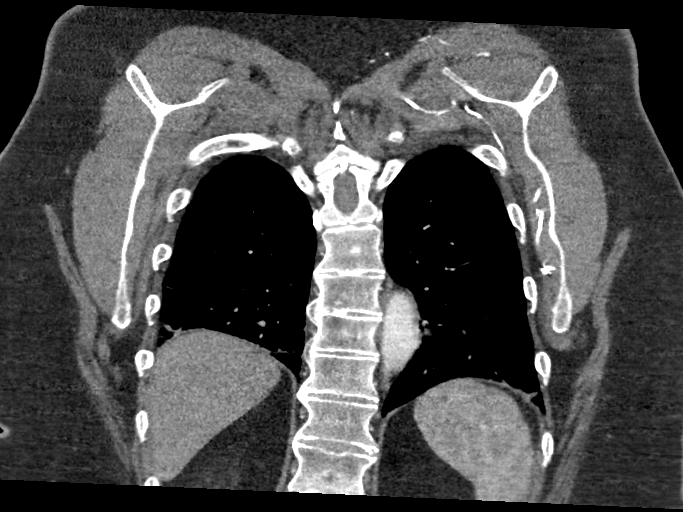

[17 of 46 positions shown; findings below may reference images not displayed]

FINDINGS: Cardiovascular: Precontrast images demonstrate no crescentic high
attenuation associated with the wall of the thoracic aorta to
suggest significant acute intramural hemorrhage. Study is limited by
considerable pulsation artifact related to cardiac motion. With
these limitations in mind no definitive evidence to suggest aortic
dissection is noted. Ascending thoracic aorta, mid arch and
descending thoracic aorta are all normal in caliber measuring
cm, 2.9 cm and 2.6 cm in diameter respectively. No significant
atherosclerotic disease noted in the thoracic aorta. No coronary
artery calcifications.

Mediastinum/Nodes: No pathologically enlarged mediastinal or hilar
lymph nodes. Hilar esophagus is unremarkable in appearance. No
axillary lymphadenopathy.

Lungs/Pleura: Scattered areas of linear scarring are noted in the
lung bases bilaterally. No acute consolidative airspace disease. No
pleural effusions. No pneumothorax. No suspicious appearing
pulmonary nodules or masses are noted.

Upper Abdomen: Status post Roux-en-Y gastric bypass.

Musculoskeletal: There are no aggressive appearing lytic or blastic
lesions noted in the visualized portions of the skeleton.

Review of the MIP images confirms the above findings.
IMPRESSION: 1. No definite acute findings to account for the patient's symptoms.
Specifically, no definite evidence of acute aortic syndrome. This
non gated CT examination is considerably limited by pulsation
artifact. If there is persistent strong clinical concern for acute
aortic syndrome, repeat cardiac gated chest CTA could be considered.

## 2021-02-07 IMAGING — CT CT HEAD W/O CM
4 series · 16 of 47 positions shown, 18 images · non-contrast
Comparison: No priors.

CLINICAL DATA: 55-year-old male with history of tension type
headache.

EXAM:
CT HEAD WITHOUT CONTRAST
TECHNIQUE: Contiguous axial images were obtained from the base of the skull
through the vertex without intravenous contrast.

[Series 2: head wo · axial · 0.52mm/px · z∈[+403,+533]mm · 7 of 36 slices shown, 9 images]
[im 5/36  brain]
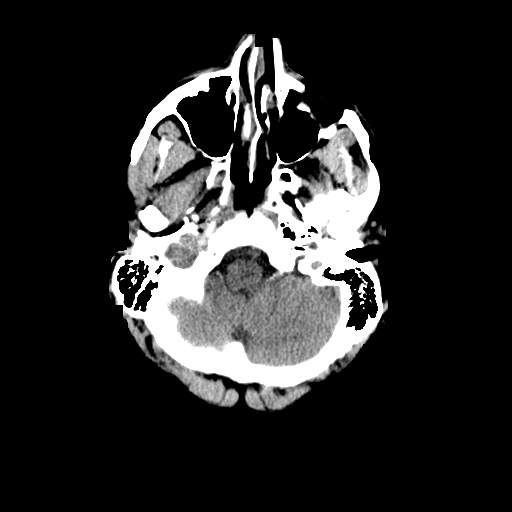
[im 5/36  bone]
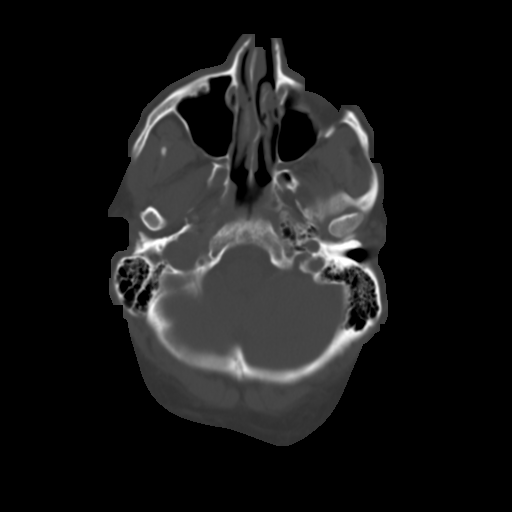
[im 9/36  brain]
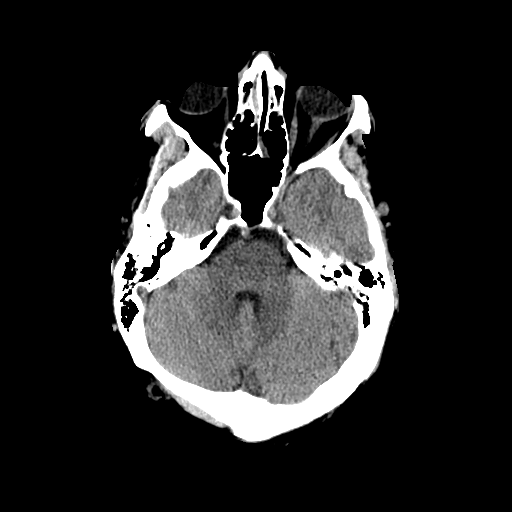
[im 14/36  brain]
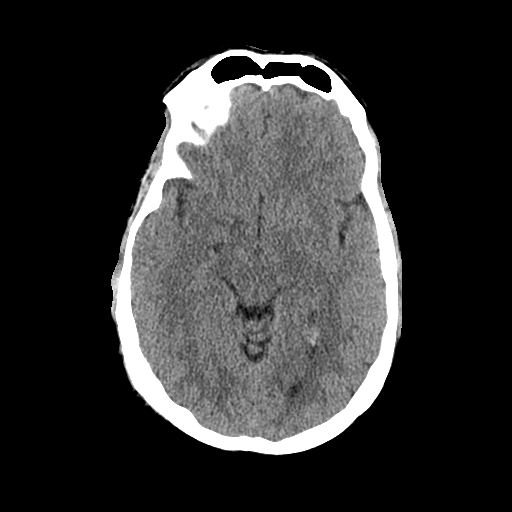
[im 18/36  brain]
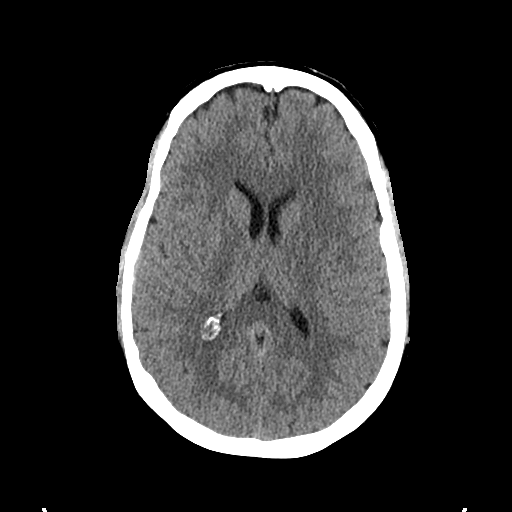
[im 22/36  brain]
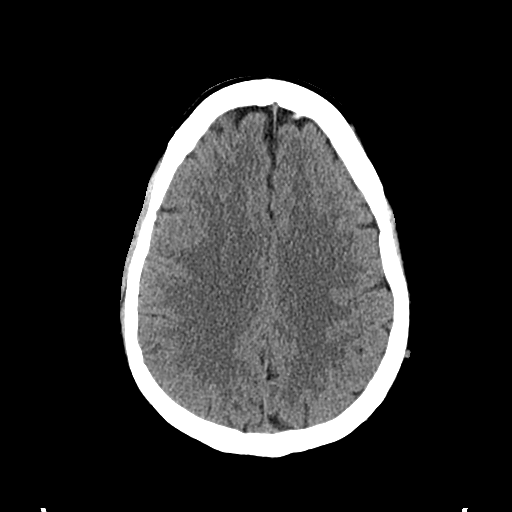
[im 22/36  bone]
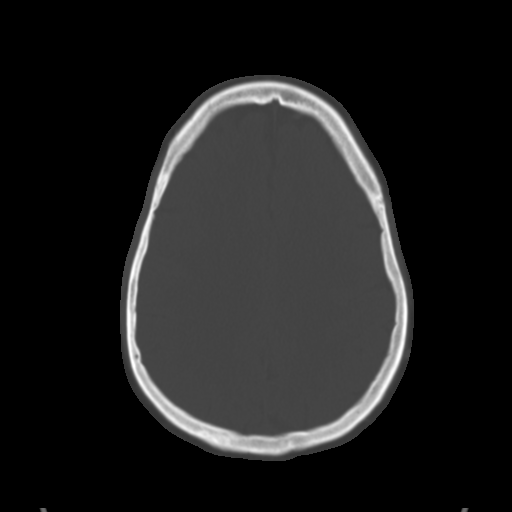
[im 27/36  brain]
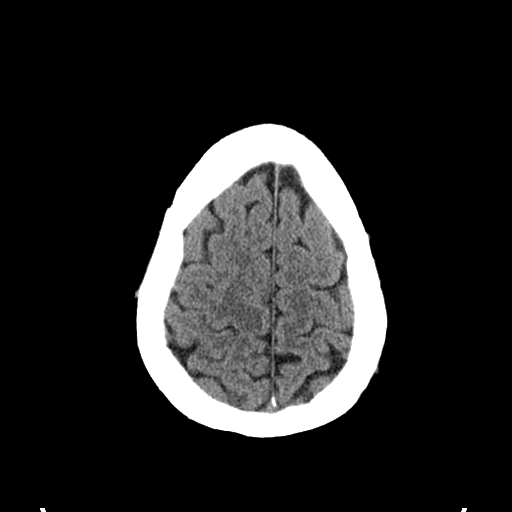
[im 31/36  brain]
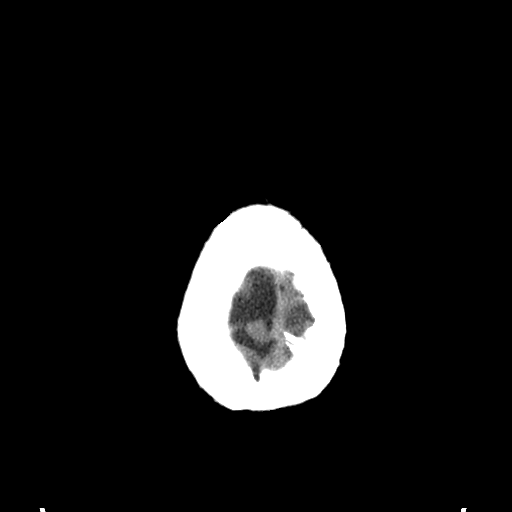

[Series 3: head bone · axial · 0.52mm/px · z∈[+399,+435]mm · 3 of 89 slices shown]
[im 9/89  bone]
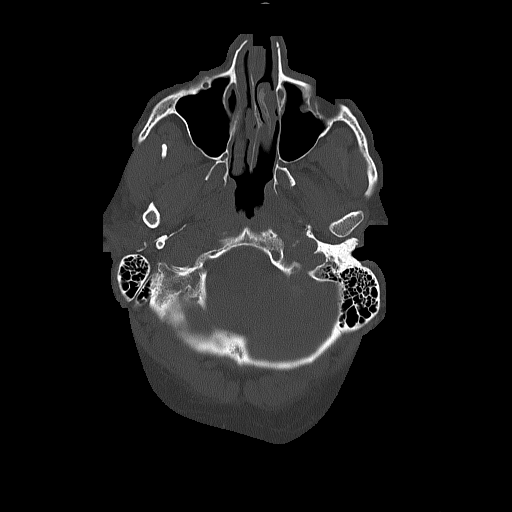
[im 18/89  bone]
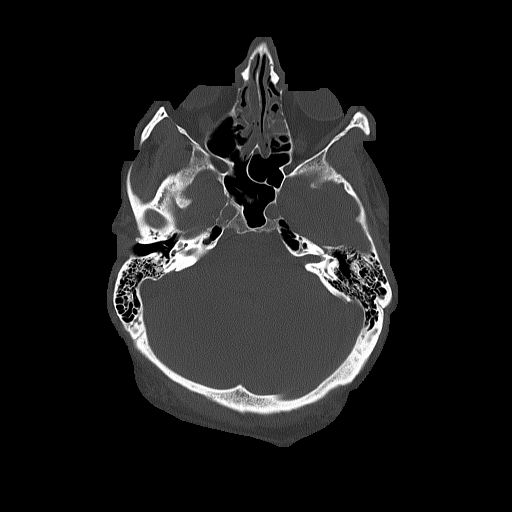
[im 27/89  bone]
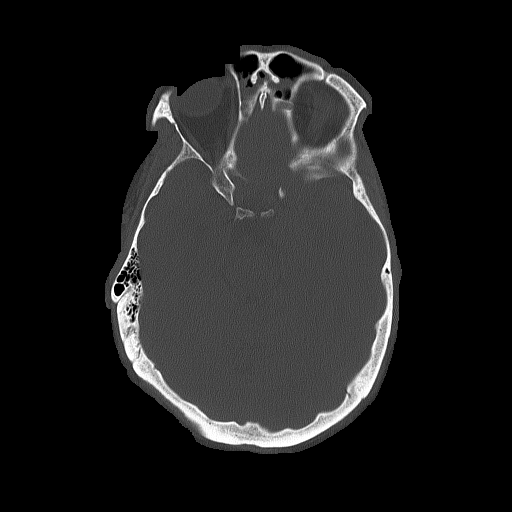

[Series 4: coronal soft tissue · coronal · 0.37mm/px · 3 of 73 slices shown]
[im 27/73  brain]
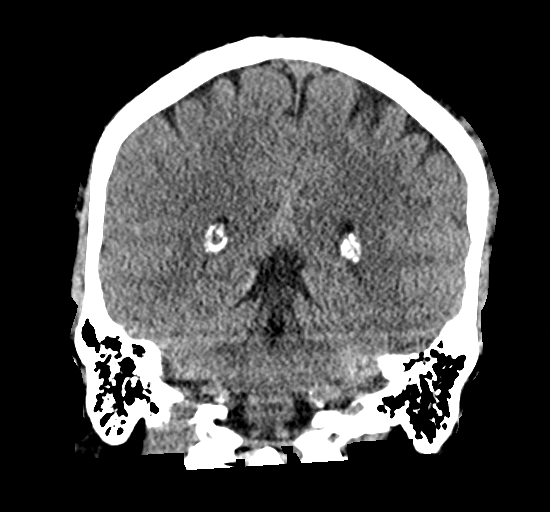
[im 33/73  brain]
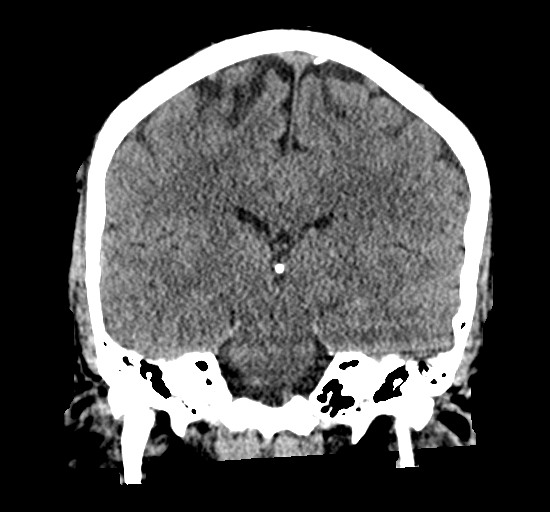
[im 40/73  brain]
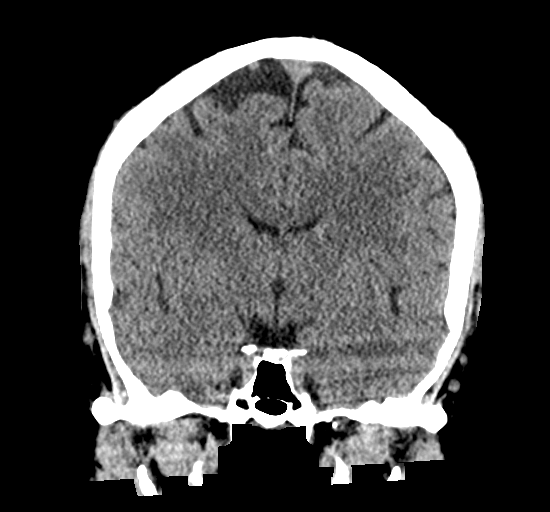

[Series 5: sagittal soft tissue · sagittal · 0.37mm/px · 3 of 58 slices shown]
[im 20/58  brain]
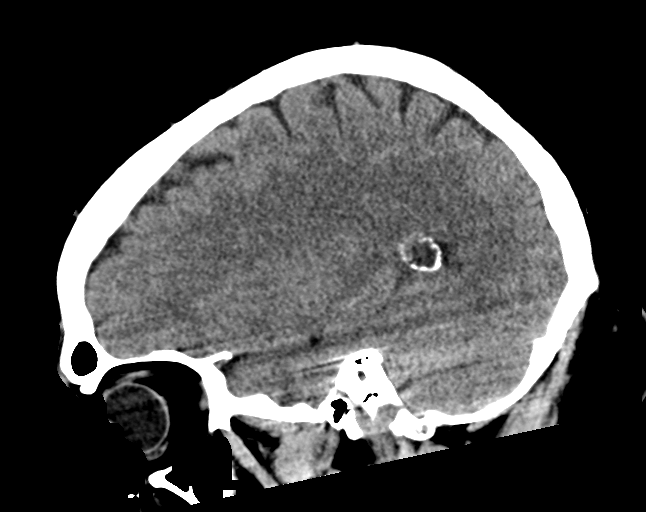
[im 29/58  brain]
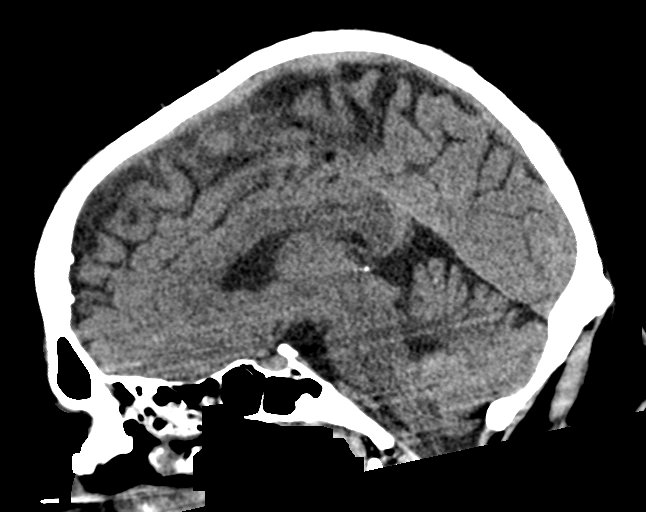
[im 39/58  brain]
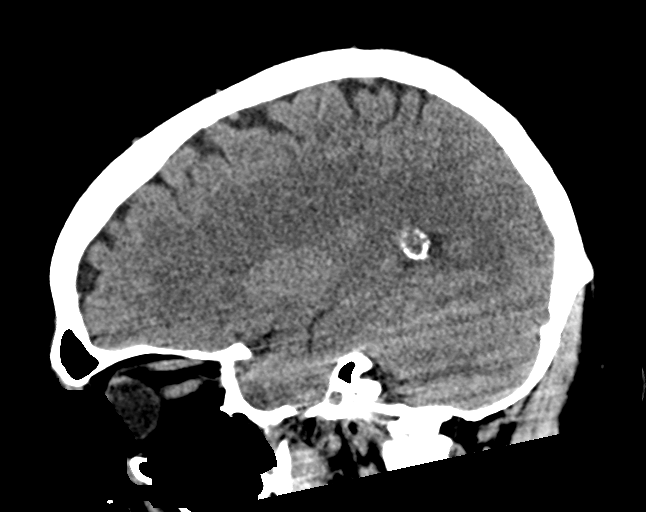

[16 of 47 positions shown; findings below may reference images not displayed]

FINDINGS: Brain: No evidence of acute infarction, hemorrhage, hydrocephalus,
extra-axial collection or mass lesion/mass effect.

Vascular: No hyperdense vessel or unexpected calcification.

Skull: Normal. Negative for fracture or focal lesion.

Sinuses/Orbits: Multifocal mucosal thickening noted in the paranasal
sinuses, most evident in the ethmoid sinuses bilaterally,
frontoethmoidal recesses and the left maxillary sinus. Some frothy
secretions are also noted in the right frontal sinus.

Other: None.
IMPRESSION: 1. No acute intracranial abnormalities.
2. Mild paranasal sinus disease, as above.

## 2021-02-07 MED ORDER — ENOXAPARIN SODIUM 40 MG/0.4ML IJ SOSY: 40.0000 mg | PREFILLED_SYRINGE | INTRAMUSCULAR | Status: DC

## 2021-02-07 MED ORDER — ALPRAZOLAM 0.5 MG PO TABS
0.5000 mg | ORAL_TABLET | Freq: Every evening | ORAL | Status: DC | PRN
Start: 2021-02-07 — End: 2021-02-08

## 2021-02-07 MED ORDER — SUMATRIPTAN SUCCINATE 6 MG/0.5ML ~~LOC~~ SOLN
6.0000 mg | SUBCUTANEOUS | Status: DC | PRN
  Administered 2021-02-08: 6 mg via SUBCUTANEOUS
  Filled 2021-02-07 (×3): qty 0.5

## 2021-02-07 MED ORDER — KETOROLAC TROMETHAMINE 30 MG/ML IJ SOLN
15.0000 mg | Freq: Once | INTRAMUSCULAR | Status: AC
  Administered 2021-02-07: 15 mg via INTRAMUSCULAR

## 2021-02-07 MED ORDER — MEPERIDINE HCL 25 MG/ML IJ SOLN
12.5000 mg | Freq: Once | INTRAMUSCULAR | Status: AC
  Administered 2021-02-07: 12.5 mg via INTRAVENOUS
  Filled 2021-02-07 (×2): qty 1

## 2021-02-07 MED ORDER — METOCLOPRAMIDE HCL 10 MG PO TABS
10.0000 mg | ORAL_TABLET | Freq: Once | ORAL | Status: AC
  Administered 2021-02-07: 10 mg via ORAL
  Filled 2021-02-07: qty 1

## 2021-02-07 MED ORDER — IOHEXOL 350 MG/ML SOLN
100.0000 mL | Freq: Once | INTRAVENOUS | Status: AC | PRN
  Administered 2021-02-07: 100 mL via INTRAVENOUS

## 2021-02-07 MED ORDER — ENOXAPARIN SODIUM 60 MG/0.6ML IJ SOSY
0.5000 mg/kg | PREFILLED_SYRINGE | INTRAMUSCULAR | Status: DC
  Administered 2021-02-07: 22:00:00 60 mg via SUBCUTANEOUS
  Filled 2021-02-07: qty 0.6

## 2021-02-07 MED ORDER — VERAPAMIL HCL ER 240 MG PO TBCR
240.0000 mg | EXTENDED_RELEASE_TABLET | Freq: Every day | ORAL | Status: DC
  Administered 2021-02-07: 240 mg via ORAL
  Filled 2021-02-07 (×2): qty 1

## 2021-02-07 MED ORDER — PANTOPRAZOLE SODIUM 40 MG PO TBEC: 80.0000 mg | DELAYED_RELEASE_TABLET | Freq: Every day | ORAL | Status: DC

## 2021-02-07 MED ORDER — MORPHINE SULFATE (PF) 4 MG/ML IV SOLN: 4.0000 mg | INTRAVENOUS | Status: DC | PRN

## 2021-02-07 MED ORDER — KETOROLAC TROMETHAMINE 30 MG/ML IJ SOLN
15.0000 mg | Freq: Once | INTRAMUSCULAR | Status: DC
  Filled 2021-02-07: qty 1

## 2021-02-07 MED ORDER — ONDANSETRON HCL 4 MG PO TABS: 4.0000 mg | ORAL_TABLET | Freq: Four times a day (QID) | ORAL | Status: DC | PRN

## 2021-02-07 MED ORDER — ACETAMINOPHEN 650 MG RE SUPP: 650.0000 mg | Freq: Four times a day (QID) | RECTAL | Status: DC | PRN

## 2021-02-07 MED ORDER — ASPIRIN 81 MG PO CHEW
324.0000 mg | CHEWABLE_TABLET | Freq: Once | ORAL | Status: AC
  Administered 2021-02-07: 324 mg via ORAL
  Filled 2021-02-07: qty 4

## 2021-02-07 MED ORDER — ONDANSETRON HCL 4 MG/2ML IJ SOLN: 4.0000 mg | Freq: Four times a day (QID) | INTRAMUSCULAR | Status: DC | PRN

## 2021-02-07 MED ORDER — ACETAMINOPHEN 500 MG PO TABS: 1000.0000 mg | ORAL_TABLET | Freq: Four times a day (QID) | ORAL | Status: DC | PRN

## 2021-02-07 NOTE — ED Notes (Signed)
Pt to CT

## 2021-02-07 NOTE — ED Provider Notes (Signed)
[facility] Emergency Department Provider Note   ____________________________________________   Event Date/Time   First MD Initiated Contact with Patient 02/07/21 1738     (approximate)  I have reviewed the triage vital signs and the nursing notes.   HISTORY  Chief Complaint Chest Pain and Headache    HPI Alex Patel is a 55 y.o. male being seen pain left-sided chest since last night  Also experiencing a bandlike headache which is a bit unusual.  Normally experiences migraines, but he is having a bandlike headache for the last approximately 24 hours.  Chest pain started first.  Feels like a pulling or ripping feeling in his left upper chest.  Does not radiate.  Does not radiate to the back.  No nausea or vomiting no abdominal pain.  Positive for headache but no neck pain  No cardiac history.  Pain was more severe and is now mild, headache no more typical of his migraine with it being left-sided unilateral nature which is typical of his migraines but preceding felt like a bandlike constriction  Both patient and his wife report that his headaches have responded well in the past to meperidine ("Demerol")  History reviewed. No pertinent past medical history. Migraines  Patient Active Problem List   Diagnosis Date Noted   Migraine aura, persistent, intractable 02/07/2021   GERD (gastroesophageal reflux disease) 02/07/2021   Elevated troponin 02/07/2021   Superficial thrombophlebitis 02/13/2016   Varicose veins of leg with pain, right 02/13/2016    Past Surgical History:  Procedure Laterality Date   APPENDECTOMY     GASTRIC BYPASS     LASER ABLATION Bilateral     Prior to Admission medications   Medication Sig Start Date End Date Taking? Authorizing Provider  ALPRAZolam Prudy Feeler) 0.5 MG tablet  07/30/16   [provider]  esomeprazole (NEXIUM) 40 MG capsule Take 40 mg by mouth.    [provider]  Multiple Vitamin  (MULTI-VITAMINS) TABS Take by mouth.    [provider]  SUMAtriptan (IMITREX) 100 MG tablet take 1 tablet by mouth immediately may repeat after 2 hours if needed 05/15/15   [provider]  SUMAtriptan 6 MG/0.5ML SOAJ INJECT 1 DOSE UNDER THE SKIN FOR MIGRAINES. MAY REPEAT IN 1 HOUR IF MIGRAINE PERSISTS-MAX OF 18 07/09/16   [provider]  traZODone (DESYREL) 100 MG tablet take 1 tablet by mouth at bedtime 01/27/14   [provider]  verapamil (CALAN-SR) 240 MG CR tablet Take by mouth.    [provider]    Allergies Patient has no known allergies.  Family History  Problem Relation Age of Onset   Hypertension Mother    Varicose Veins Father    Cancer Maternal Grandmother    Diabetes Maternal Grandmother    Varicose Veins Paternal Grandmother     Social History Social History   Tobacco Use   Smoking status: Never   Smokeless tobacco: Never  Substance Use Topics   Alcohol use: No   Drug use: No    Review of Systems Constitutional: No fever/chills Eyes: No visual changes. ENT: No neck pain Cardiovascular: See HPI Respiratory: Denies shortness of breath. Gastrointestinal: No abdominal pain.   Genitourinary: Negative for dysuria. Musculoskeletal: Negative for back pain. Skin: Negative for rash. Neurological: Negative for areas of focal weakness or numbness.    ____________________________________________   PHYSICAL EXAM:  VITAL SIGNS: ED Triage Vitals [02/07/21 1340]  Enc Vitals Group     BP 113/77  Pulse Rate 73     Resp 18     Temp 97.9 F (36.6 C)     Temp Source Oral     SpO2 100 %     Weight 260 lb (117.9 kg)     Height 6\' 1"  (1.854 m)     Head Circumference      Peak Flow      Pain Score 6     Pain Loc      Pain Edu?      Excl. in [place]?     Constitutional: Alert and oriented. Well appearing and in no acute distress. Eyes: Conjunctivae are normal. Head: Atraumatic. Nose: No  congestion/rhinnorhea. Mouth/Throat: Mucous membranes are moist. Neck: No stridor.  No meningismus Cardiovascular: Normal rate, regular rhythm. Grossly normal heart sounds.  Good peripheral circulation. Respiratory: Normal respiratory effort.  No retractions. Lungs CTAB. Gastrointestinal: Soft and nontender. No distention. Musculoskeletal: No lower extremity tenderness nor edema.  Sits up independently without difficulty. Neurologic:  Normal speech and language. No gross focal neurologic deficits are appreciated.  Skin:  Skin is warm, dry and intact. No rash noted. Psychiatric: Mood and affect are normal. Speech and behavior are normal.  ____________________________________________   LABS (all labs ordered are listed, but only abnormal results are displayed)  Labs Reviewed  BASIC METABOLIC PANEL - Abnormal; Notable for the following components:      Result Value   Calcium 8.7 (*)    Anion gap 3 (*)    All other components within normal limits  CBC - Abnormal; Notable for the following components:   Hemoglobin 10.4 (*)    HCT 35.4 (*)    MCV 75.5 (*)    MCH 22.2 (*)    MCHC 29.4 (*)    RDW 16.8 (*)    All other components within normal limits  TROPONIN I (HIGH SENSITIVITY) - Abnormal; Notable for the following components:   Troponin I (High Sensitivity) 44 (*)    All other components within normal limits  TROPONIN I (HIGH SENSITIVITY) - Abnormal; Notable for the following components:   Troponin I (High Sensitivity) 36 (*)    All other components within normal limits  RESP PANEL BY RT-PCR (FLU A&B, COVID) ARPGX2  HIV ANTIBODY (ROUTINE TESTING W REFLEX)  BASIC METABOLIC PANEL  CBC   ____________________________________________  EKG  Reviewed inter by me at 1400 Heart rate 70 QRS 99 QTc 440 Normal sinus rhythm, no evidence of acute ischemia noted. ____________________________________________  RADIOLOGY  DG Chest 2 View  Result Date: 02/07/2021 CLINICAL DATA:   55 year old male with history of chest pain. EXAM: CHEST - 2 VIEW COMPARISON:  No priors. FINDINGS: Lung volumes are low. No consolidative airspace disease. No pleural effusions. No pneumothorax. No pulmonary nodule or mass noted. Pulmonary vasculature and the cardiomediastinal silhouette are within normal limits. IMPRESSION: 1. Low lung volumes without radiographic evidence of acute cardiopulmonary disease. Electronically Signed   By: Vinnie Langton M.D.   On: 02/07/2021 14:12   CT HEAD WO CONTRAST (5MM)  Result Date: 02/07/2021 CLINICAL DATA:  55 year old male with history of tension type headache. EXAM: CT HEAD WITHOUT CONTRAST TECHNIQUE: Contiguous axial images were obtained from the base of the skull through the vertex without intravenous contrast. COMPARISON:  No priors. FINDINGS: Brain: No evidence of acute infarction, hemorrhage, hydrocephalus, extra-axial collection or mass lesion/mass effect. Vascular: No hyperdense vessel or unexpected calcification. Skull: Normal. Negative for fracture or focal lesion. Sinuses/Orbits: Multifocal mucosal thickening noted in the paranasal sinuses,  most evident in the ethmoid sinuses bilaterally, frontoethmoidal recesses and the left maxillary sinus. Some frothy secretions are also noted in the right frontal sinus. Other: None. IMPRESSION: 1. No acute intracranial abnormalities. 2. Mild paranasal sinus disease, as above. Electronically Signed   By: Vinnie Langton M.D.   On: 02/07/2021 20:12   CT Angio Chest Aorta W and/or Wo Contrast  Result Date: 02/07/2021 CLINICAL DATA:  55 year old male with history of chest and back pain. Headache. Evaluate for aortic dissection. EXAM: CT ANGIOGRAPHY CHEST WITH CONTRAST TECHNIQUE: Multidetector CT imaging of the chest was performed using the standard protocol during bolus administration of intravenous contrast. Multiplanar CT image reconstructions and MIPs were obtained to evaluate the vascular anatomy. CONTRAST:  166mL  OMNIPAQUE IOHEXOL 350 MG/ML SOLN COMPARISON:  No priors. FINDINGS: Cardiovascular: Precontrast images demonstrate no crescentic high attenuation associated with the wall of the thoracic aorta to suggest significant acute intramural hemorrhage. Study is limited by considerable pulsation artifact related to cardiac motion. With these limitations in mind no definitive evidence to suggest aortic dissection is noted. Ascending thoracic aorta, mid arch and descending thoracic aorta are all normal in caliber measuring 3.2 cm, 2.9 cm and 2.6 cm in diameter respectively. No significant atherosclerotic disease noted in the thoracic aorta. No coronary artery calcifications. Mediastinum/Nodes: No pathologically enlarged mediastinal or hilar lymph nodes. Hilar esophagus is unremarkable in appearance. No axillary lymphadenopathy. Lungs/Pleura: Scattered areas of linear scarring are noted in the lung bases bilaterally. No acute consolidative airspace disease. No pleural effusions. No pneumothorax. No suspicious appearing pulmonary nodules or masses are noted. 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 Musculoskeletal: There are no aggressive appearing lytic or blastic lesions noted in the visualized portions of the skeleton. Review of the MIP images confirms the above findings. IMPRESSION: 1. No definite acute findings to account for the patient's symptoms. Specifically, no definite evidence of acute aortic syndrome. This non gated CT examination is considerably limited by pulsation artifact. If there is persistent strong clinical concern for acute aortic syndrome, repeat cardiac gated chest CTA could be considered. Electronically Signed   By: Vinnie Langton M.D.   On: 02/07/2021 20:09    CT head negative for acute finding.  CT angiography chest with somewhat limited due to pulsation artifact but overall no acute findings ____________________________________________   PROCEDURES  Procedure(s) performed:  None  Procedures  Critical Care performed: No  ____________________________________________   INITIAL IMPRESSION / ASSESSMENT AND PLAN / ED COURSE  Pertinent labs & imaging results that were available during my care of the patient were reviewed by me and considered in my medical decision making (see chart for details).   Differential diagnosis includes, but is not limited to, ACS, aortic dissection, pulmonary embolism, cardiac tamponade, pneumothorax, pneumonia, pericarditis, myocarditis, GI-related causes including esophagitis/gastritis, and musculoskeletal chest wall pain.    Regarding headache, has a history of frequent migraine-like headaches which she currently reports experiencing but also was experiencing a strange bandlike feeling earlier which is atypical of his headaches.  Etiology is not clear, but will obtain CT of the head to evaluate exclude intracranial hemorrhage or gross abnormality.  His primary complaint however is left-sided chest pain that has a tearing nature to it.  Elevated troponins are noted, but wish to exclude dissection, PE etc.  We will proceed with CT angio of the chest   Asa given  ----------------------------------------- 8:35 PM on 02/07/2021 ----------------------------------------- Patient resting reports ongoing moderate migraine-like headache.  Patient requesting additional medication for his headache,  awaiting Demerol.  Decision to give Demerol based upon the patient's report of previous good results for treatment of his migraine.  He is wife both report that Demerol has been given in the past and is worked well for him without complication, also he normally takes Imitrex but given his presentation today with elevated troponins we will withhold this.  We did discuss potential risks and side effects of Demerol, but patient is wife both report good effect and very helpful for his migraines in the past.  We will trial a dose here and patient on monitor being  observed closely   Admission discussed with Dr. Rupert Stacks.  Patient will be admitted for further work-up regarding symptomatology including chest pain with mildly elevated troponins indicative of need for admission and further work-up        ____________________________________________   FINAL CLINICAL IMPRESSION(S) / ED DIAGNOSES  Final diagnoses:  Chest pain, moderate coronary artery risk        Note:  This document was prepared using Dragon voice recognition software and may include unintentional dictation errors       Delman Kitten, MD 02/07/21 2146

## 2021-02-07 NOTE — Progress Notes (Signed)
PHARMACIST - PHYSICIAN COMMUNICATION  CONCERNING:  Enoxaparin (Lovenox) for DVT Prophylaxis    RECOMMENDATION: Patient was prescribed enoxaprin 40mg  q24 hours for VTE prophylaxis.   Filed Weights   02/07/21 1340  Weight: 117.9 kg (260 lb)    Body mass index is 34.3 kg/m.  Estimated Creatinine Clearance: 132.1 mL/min (by C-G formula based on SCr of 0.87 mg/dL).   Based on [facility] policy patient is candidate for enoxaparin 0.5mg /kg TBW SQ every 24 hours based on BMI being >30.  DESCRIPTION: Pharmacy has adjusted enoxaparin dose per [facility] policy.  Patient is now receiving enoxaparin 0.5 mg/kg every 24 hours    [facility], PharmD Clinical Pharmacist  02/07/2021 8:43 PM

## 2021-02-07 NOTE — ED Triage Notes (Signed)
Pt here with CP and headache that started this morning. Pt states pain is left sided and does not radiate. Pt states that he feels like his heart was beating out of his chest. Pt in NAD in triage.

## 2021-02-07 NOTE — ED Notes (Signed)
Pt  states he is getting a migraine and is requesting some thing for the migraine like his imitrex he normally takes at home, spoke with EDP and orders initiated

## 2021-02-07 NOTE — H&P (Addendum)
History and Physical   Alex Patel HWE:[id number] DOB: 13-Feb-1966 DOA: 02/07/2021  PCP: Marina Goodell, MD  Patient coming from: Home  I have personally briefly reviewed patient's old medical records in [facility] EMR.  Chief Concern: Chest pain and headache  HPI: Alex Patel is a 55 y.o. right-handed male with medical history significant for migraine headaches, primary insomnia who presents emergency department for chief concerns of chest pain and headaches that started a.m. on day of presentation.  He endorses chest pain that start started at 8 pm evening 02/06/21, and describes it as a ripping sensation, persistent, at the peak, it was 7/10 and now it is a 2/10.  he denies shortness of breath, nausea, vomiting.   He reports that 30 minutes after the chest pain, he developed a weird headache and is different compared to his prior migraine headaches. He states he has had migraines since age 28 and he has never felt something like this. He states this one is like a band around his head squeezing.   Of note, he was at work, and in the middle of a meeting with his staff, he could not remember their names, this lasted about 15-20 seconds. He reports that his staff and/or collegues did not report any facial drooping. His main reason for coming to the ED is due tot he chest pain and headaches.   At bedside he is able to tell me his name, age, current location of hospital and the current calendar year.  On physical exam both the right upper and right lower extremity were mildly weaker than his left upper and left lower extremities.  Social history: He smokes 5-7 cigarettes per day, infrequent etoh use (last drink in August 2022), He denies recreational drug use. He lives at home with his wife and daughter. He works as a Production designer, theatre/television/film at the [employer] history: He is vaccinated for covid 19, 3 doses  ROS: Constitutional: no weight change, no fever ENT/Mouth: no sore  throat, no rhinorrhea Eyes: no eye pain, no vision changes Cardiovascular: no chest pain, no dyspnea,  no edema, no palpitations Respiratory: no cough, no sputum, no wheezing Gastrointestinal: no nausea, no vomiting, no diarrhea, no constipation Genitourinary: no urinary incontinence, no dysuria, no hematuria Musculoskeletal: no arthralgias, no myalgias Skin: no skin lesions, no pruritus, Neuro: + weakness, no loss of consciousness, no syncope Psych: no anxiety, no depression, + decrease appetite Heme/Lymph: no bruising, no bleeding  ED Course: Discussed with ED provider, patient requiring hospitalization for chief concerns for elevated troponin.  Vitals in the emergency department was remarkable for temperature of 97.9, respiration rate of 18, heart rate of 73, blood pressure 113/77, improved to 120/77, SPO2 100% on room air.  Labs in the emergency department was remarkable for serum sodium 138, potassium 3.5, chloride 109, bicarb 27, BUN of 17, serum creatinine of 0.87, nonfasting blood glucose 85, GFR greater than 60, WBC 8.2, hemoglobin 10.4, platelets is 252.  COVID/influenza A/influenza B PCR were negative.  High sensitive troponin was elevated at 44 and improved to 36.  Assessment/Plan  Principal Problem:   Migraine aura, persistent, intractable Active Problems:   GERD (gastroesophageal reflux disease)   Elevated troponin   # New headache with chest pain - With associated memory lapse during a meeting that he called - Initial CT head without contrast was read as no acute intracranial abnormalities, mild paranasal sinus disease - MRI of the brain without contrast has been ordered -  One-time dose of aspirin 325 mg p.o. once ordered - A.m. lipid panel and A1c ordered, check B12, vitamin D, magnesium  # Tobacco use disorder - Patient endorses readiness to stop tobacco use. - Tobacco cessation counseling:  Week one, smoke 5 cigarettes per day. Week two, smoke 4 cigarettes  per day. Week three, smoke 3 cigarettes per day, continue until smoking half the amount of cigarettes per day Discuss with PCP for pharmacologic assistance with smoking cessation Clean all indoor clothing, sheets, blankets, and freshen textile furniture to rid the smell of cigarettes Only smoke outside and wear outer covering Leave cigarettes and lighters outside in separate places During in-between cigarettes, if you feel the urge to smoke, use the following: stress squeezing devices/phone a trusted friend to talk you through the urge/walk in a safe environment Avoid prolong interactions with individuals actively smoking cigarettes If you smoke in social setting, avoid social setting where cigarette smoking is expected or considered acceptable  Call [place] if in need of nicotine patches to help with cessation  Plates and screws in left ankle-as noted on MRI ordered  # GERD-PPI  # Insomnia-trazodone 100 mg nightly resumed  # History of migraines-resumed home sumatriptan injection  Chart reviewed.   DVT prophylaxis: Enoxaparin subcutaneous every 24 hours Code Status: Full code Diet: Heart healthy Family Communication: No Disposition Plan: Pending clinical course Consults called: None at this time Admission status: MedSurg, observation  History reviewed. No pertinent past medical history.  Past Surgical History:  Procedure Laterality Date   APPENDECTOMY     GASTRIC BYPASS     LASER ABLATION Bilateral    Social History:  reports that he has never smoked. He has never used smokeless tobacco. He reports that he does not drink alcohol and does not use drugs.  No Known Allergies Family History  Problem Relation Age of Onset   Hypertension Mother    Varicose Veins Father    Cancer Maternal Grandmother    Diabetes Maternal Grandmother    Varicose Veins Paternal Grandmother    Family history: Family history reviewed and not pertinent  Prior to Admission medications    Medication Sig Start Date End Date Taking? Authorizing Provider  ALPRAZolam Duanne Moron) 0.5 MG tablet  07/30/16   [provider]  esomeprazole (NEXIUM) 40 MG capsule Take 40 mg by mouth.    [provider]  Multiple Vitamin (MULTI-VITAMINS) TABS Take by mouth.    [provider]  SUMAtriptan (IMITREX) 100 MG tablet take 1 tablet by mouth immediately may repeat after 2 hours if needed 05/15/15   [provider]  SUMAtriptan 6 MG/0.5ML SOAJ INJECT 1 DOSE UNDER THE SKIN FOR MIGRAINES. MAY REPEAT IN 1 HOUR IF MIGRAINE PERSISTS-MAX OF 18 07/09/16   [provider]  traZODone (DESYREL) 100 MG tablet take 1 tablet by mouth at bedtime 01/27/14   [provider]  verapamil (CALAN-SR) 240 MG CR tablet Take by mouth.    [provider]   Physical Exam: Vitals:   02/07/21 1340 02/07/21 1927  BP: 113/77 120/77  Pulse: 73 70  Resp: 18 18  Temp: 97.9 F (36.6 C) 98 F (36.7 C)  TempSrc: Oral Oral  SpO2: 100% 99%  Weight: 117.9 kg   Height: 6\' 1"  (1.854 m)    Constitutional: appears age-appropriate, NAD, calm, comfortable Eyes: PERRL, lids and conjunctivae normal ENMT: Mucous membranes are moist. Posterior pharynx clear of any exudate or lesions. Age-appropriate dentition. Hearing appropriate Neck: normal, supple, no masses,  no thyromegaly Respiratory: clear to auscultation bilaterally, no wheezing, no crackles. Normal respiratory effort. No accessory muscle use.  Cardiovascular: Regular rate and rhythm, no murmurs / rubs / gallops. No extremity edema. 2+ pedal pulses. No carotid bruits.  Abdomen: Obese abdomen, no tenderness, no masses palpated, no hepatosplenomegaly. Bowel sounds positive.  Musculoskeletal: no clubbing / cyanosis. No joint deformity upper and lower extremities. Good ROM, no contractures, no atrophy. Normal muscle tone.  Skin: no rashes, lesions, ulcers. No induration Neurologic: Sensation intact. Strength in the left side  greater than the right Psychiatric: Normal judgment and insight. Alert and oriented x 3. Normal mood.   EKG: independently reviewed, showing low amplitude, sinus rhythm with rate of 70, QTc 442  Chest x-ray on Admission: I personally reviewed and I agree with radiologist reading as below.  DG Chest 2 View  Result Date: 02/07/2021 CLINICAL DATA:  55 year old male with history of chest pain. EXAM: CHEST - 2 VIEW COMPARISON:  No priors. FINDINGS: Lung volumes are low. No consolidative airspace disease. No pleural effusions. No pneumothorax. No pulmonary nodule or mass noted. Pulmonary vasculature and the cardiomediastinal silhouette are within normal limits. IMPRESSION: 1. Low lung volumes without radiographic evidence of acute cardiopulmonary disease. Electronically Signed   By: Vinnie Langton M.D.   On: 02/07/2021 14:12   CT HEAD WO CONTRAST (5MM)  Result Date: 02/07/2021 CLINICAL DATA:  55 year old male with history of tension type headache. EXAM: CT HEAD WITHOUT CONTRAST TECHNIQUE: Contiguous axial images were obtained from the base of the skull through the vertex without intravenous contrast. COMPARISON:  No priors. FINDINGS: Brain: No evidence of acute infarction, hemorrhage, hydrocephalus, extra-axial collection or mass lesion/mass effect. Vascular: No hyperdense vessel or unexpected calcification. Skull: Normal. Negative for fracture or focal lesion. Sinuses/Orbits: Multifocal mucosal thickening noted in the paranasal sinuses, most evident in the ethmoid sinuses bilaterally, frontoethmoidal recesses and the left maxillary sinus. Some frothy secretions are also noted in the right frontal sinus. Other: None. IMPRESSION: 1. No acute intracranial abnormalities. 2. Mild paranasal sinus disease, as above. Electronically Signed   By: Vinnie Langton M.D.   On: 02/07/2021 20:12   CT Angio Chest Aorta W and/or Wo Contrast  Result Date: 02/07/2021 CLINICAL DATA:  55 year old male with history of  chest and back pain. Headache. Evaluate for aortic dissection. EXAM: CT ANGIOGRAPHY CHEST WITH CONTRAST TECHNIQUE: Multidetector CT imaging of the chest was performed using the standard protocol during bolus administration of intravenous contrast. Multiplanar CT image reconstructions and MIPs were obtained to evaluate the vascular anatomy. CONTRAST:  190mL OMNIPAQUE IOHEXOL 350 MG/ML SOLN COMPARISON:  No priors. FINDINGS: Cardiovascular: Precontrast images demonstrate no crescentic high attenuation associated with the wall of the thoracic aorta to suggest significant acute intramural hemorrhage. Study is limited by considerable pulsation artifact related to cardiac motion. With these limitations in mind no definitive evidence to suggest aortic dissection is noted. Ascending thoracic aorta, mid arch and descending thoracic aorta are all normal in caliber measuring 3.2 cm, 2.9 cm and 2.6 cm in diameter respectively. No significant atherosclerotic disease noted in the thoracic aorta. No coronary artery calcifications. Mediastinum/Nodes: No pathologically enlarged mediastinal or hilar lymph nodes. Hilar esophagus is unremarkable in appearance. No axillary lymphadenopathy. Lungs/Pleura: Scattered areas of linear scarring are noted in the lung bases bilaterally. No acute consolidative airspace disease. No pleural effusions. No pneumothorax. No suspicious appearing pulmonary nodules or masses are noted. 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 Musculoskeletal: There are no aggressive appearing lytic or blastic  lesions noted in the visualized portions of the skeleton. Review of the MIP images confirms the above findings. IMPRESSION: 1. No definite acute findings to account for the patient's symptoms. Specifically, no definite evidence of acute aortic syndrome. This non gated CT examination is considerably limited by pulsation artifact. If there is persistent strong clinical concern for acute aortic syndrome,  repeat cardiac gated chest CTA could be considered. Electronically Signed   By: Vinnie Langton M.D.   On: 02/07/2021 20:09    Labs on Admission: I have personally reviewed following labs  CBC: Recent Labs  Lab 02/07/21 1345  WBC 8.2  HGB 10.4*  HCT 35.4*  MCV 75.5*  PLT [id number]   Basic Metabolic Panel: Recent Labs  Lab 02/07/21 1345  NA 139  K 3.5  CL 109  CO2 27  GLUCOSE 85  BUN 17  CREATININE 0.87  CALCIUM 8.7*   GFR: Estimated Creatinine Clearance: 132.1 mL/min (by C-G formula based on SCr of 0.87 mg/dL).  Dr. Tobie Poet [facility]  If 7PM-7AM, please contact overnight-coverage provider If 7AM-7PM, please contact day coverage provider [email or web address]  02/07/2021, 8:44 PM

## 2021-02-08 ENCOUNTER — Other Ambulatory Visit: Payer: Self-pay | Admitting: Student

## 2021-02-08 ENCOUNTER — Observation Stay: Payer: BC Managed Care – PPO

## 2021-02-08 DIAGNOSIS — K219 Gastro-esophageal reflux disease without esophagitis: Secondary | ICD-10-CM | POA: Diagnosis not present

## 2021-02-08 DIAGNOSIS — D519 Vitamin B12 deficiency anemia, unspecified: Secondary | ICD-10-CM

## 2021-02-08 DIAGNOSIS — E559 Vitamin D deficiency, unspecified: Secondary | ICD-10-CM

## 2021-02-08 DIAGNOSIS — R778 Other specified abnormalities of plasma proteins: Secondary | ICD-10-CM | POA: Diagnosis not present

## 2021-02-08 DIAGNOSIS — E876 Hypokalemia: Secondary | ICD-10-CM

## 2021-02-08 DIAGNOSIS — G43519 Persistent migraine aura without cerebral infarction, intractable, without status migrainosus: Secondary | ICD-10-CM | POA: Diagnosis not present

## 2021-02-08 DIAGNOSIS — R0789 Other chest pain: Secondary | ICD-10-CM

## 2021-02-08 LAB — LIPID PANEL
Cholesterol: 122 mg/dL (ref 0–200)
HDL: 43 mg/dL (ref >40)
LDL Cholesterol: 71 mg/dL (ref 0–99)
Total CHOL/HDL Ratio: 2.8 RATIO
Triglycerides: 41 mg/dL (ref <150)
VLDL: 8 mg/dL (ref 0–40)

## 2021-02-08 LAB — HIV ANTIBODY (ROUTINE TESTING W REFLEX): HIV Screen 4th Generation wRfx: NONREACTIVE

## 2021-02-08 LAB — BASIC METABOLIC PANEL
Anion gap: 1 — ABNORMAL LOW (ref 5–15)
BUN: 16 mg/dL (ref 6–20)
CO2: 26 mmol/L (ref 22–32)
Calcium: 8.2 mg/dL — ABNORMAL LOW (ref 8.9–10.3)
Chloride: 111 mmol/L (ref 98–111)
Creatinine, Ser: 0.64 mg/dL (ref 0.61–1.24)
GFR, Estimated: >60 mL/min (ref >60)
Glucose, Bld: 92 mg/dL (ref 70–99)
Potassium: 3.3 mmol/L — ABNORMAL LOW (ref 3.5–5.1)
Sodium: 138 mmol/L (ref 135–145)

## 2021-02-08 LAB — CBC
HCT: 33.3 % — ABNORMAL LOW (ref 39.0–52.0)
Hemoglobin: 9.7 g/dL — ABNORMAL LOW (ref 13.0–17.0)
MCH: 21.6 pg — ABNORMAL LOW (ref 26.0–34.0)
MCHC: 29.1 g/dL — ABNORMAL LOW (ref 30.0–36.0)
MCV: 74 fL — ABNORMAL LOW (ref 80.0–100.0)
Platelets: 228 10*3/uL (ref 150–400)
RBC: 4.5 MIL/uL (ref 4.22–5.81)
RDW: 16.7 % — ABNORMAL HIGH (ref 11.5–15.5)
WBC: 7.1 10*3/uL (ref 4.0–10.5)
nRBC: 0 % (ref 0.0–0.2)

## 2021-02-08 LAB — HEMOGLOBIN A1C
Hgb A1c MFr Bld: 5.6 % (ref 4.8–5.6)
Mean Plasma Glucose: 114 mg/dL

## 2021-02-08 LAB — MAGNESIUM: Magnesium: 2.3 mg/dL (ref 1.7–2.4)

## 2021-02-08 LAB — VITAMIN B12: Vitamin B-12: 100 pg/mL — ABNORMAL LOW (ref 180–914)

## 2021-02-08 LAB — VITAMIN D 25 HYDROXY (VIT D DEFICIENCY, FRACTURES): Vit D, 25-Hydroxy: 12.74 ng/mL — ABNORMAL LOW (ref 30–100)

## 2021-02-08 IMAGING — MR MR HEAD W/O CM
12 series · 48 of 48 positions shown · non-contrast
Comparison: None.

CLINICAL DATA: Acute neurologic deficits

EXAM:
MRI HEAD WITHOUT CONTRAST
TECHNIQUE: Multiplanar, multiecho pulse sequences of the brain and surrounding
structures were obtained without intravenous contrast.

[Series 9: ax dwi_tracew · axial · 3.0mm · 0.68mm/px · z∈[-2,+163]mm · 4 of 54 slices shown]
[im 1/54]
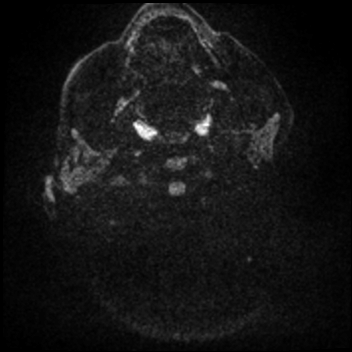
[im 18/54]
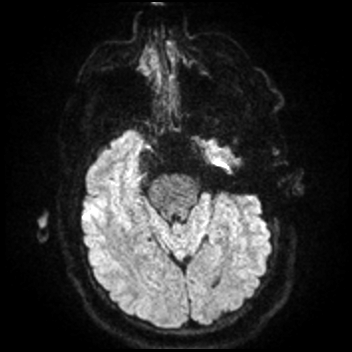
[im 36/54]
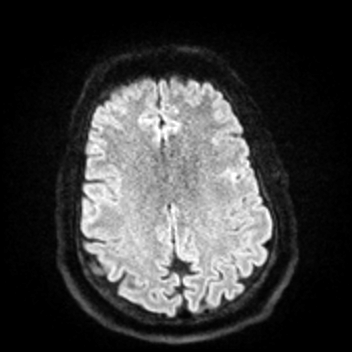
[im 54/54]
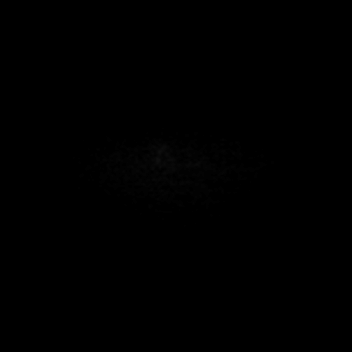

[Series 10: ax dwi_adc · axial · 3.0mm · 0.68mm/px · z∈[-2,+160]mm · 4 of 53 slices shown]
[im 1/53]
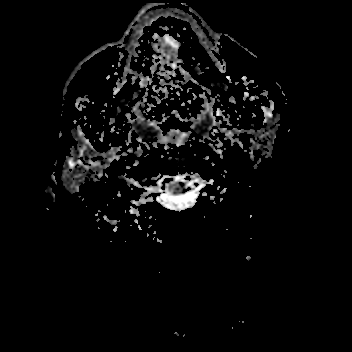
[im 18/53]
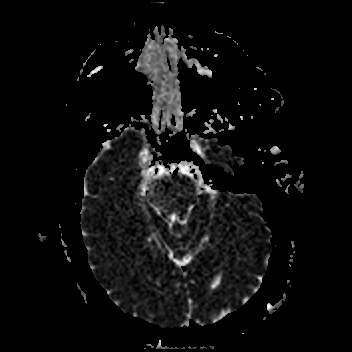
[im 35/53]
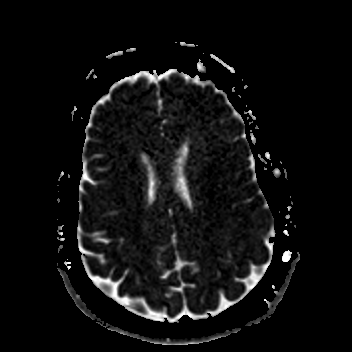
[im 53/53]
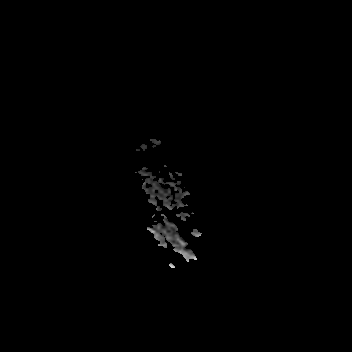

[Series 11: cor dwi_tracew · coronal · 5.0mm · 0.65mm/px · 3 of 46 slices shown]
[im 1/46]
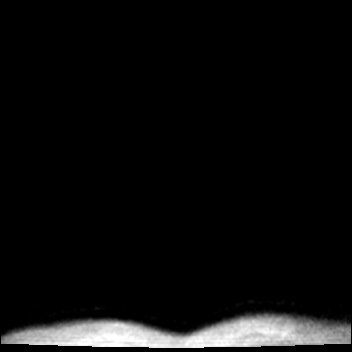
[im 23/46]
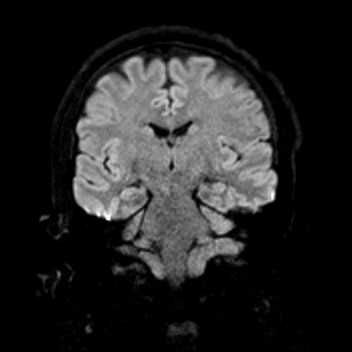
[im 46/46]
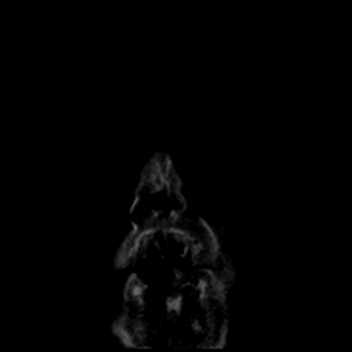

[Series 12: cor dwi_adc · coronal · 5.0mm · 0.65mm/px · 3 of 46 slices shown]
[im 1/46]
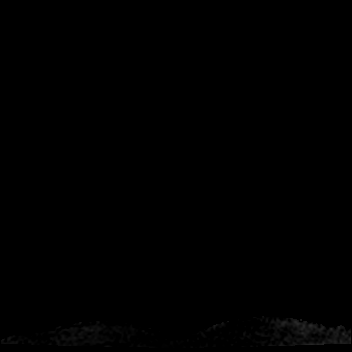
[im 23/46]
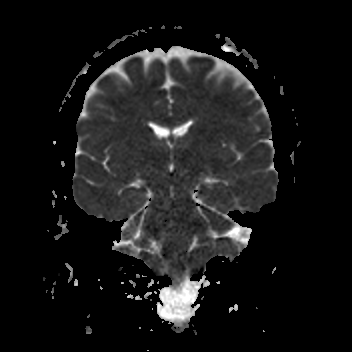
[im 46/46]
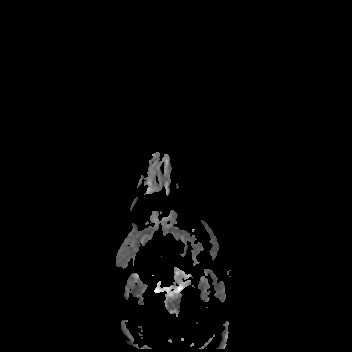

[Series 13: T1 · sagittal · 5.0mm · 0.62mm/px · 2 of 28 slices shown (1 of 2)]
[im 1/28]
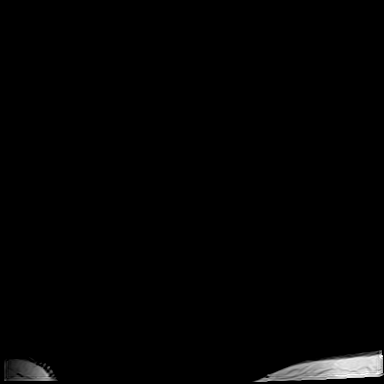
[im 28/28]
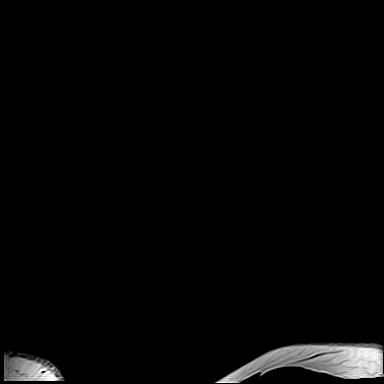

[Series 14: T2 · axial · 5.0mm · 0.58mm/px · z∈[+0,+174]mm · 2 of 32 slices shown (1 of 2)]
[im 1/32]
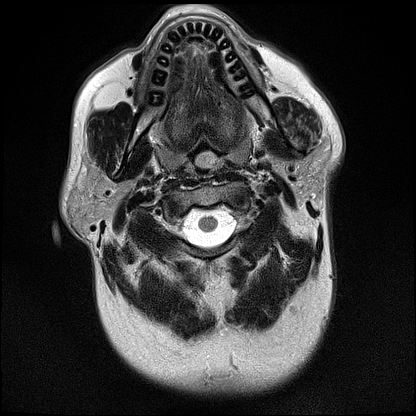
[im 32/32]
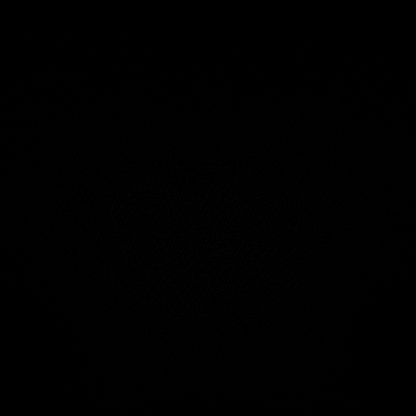

[Series 15: mag_images · axial · 3.0mm · 0.90mm/px · z∈[+3,+168]mm · 4 of 60 slices shown]
[im 1/60]
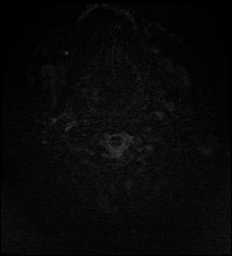
[im 20/60]
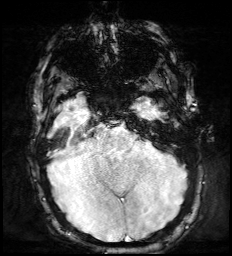
[im 40/60]
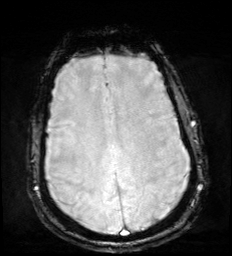
[im 60/60]
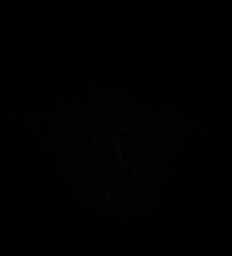

[Series 16: pha_images · axial · 3.0mm · 0.90mm/px · z∈[+6,+165]mm · 4 of 58 slices shown]
[im 1/58]
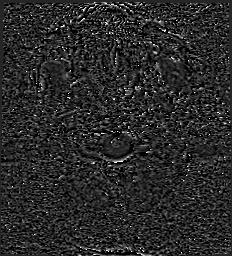
[im 20/58]
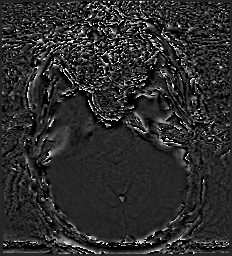
[im 39/58]
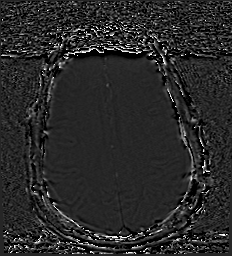
[im 58/58]
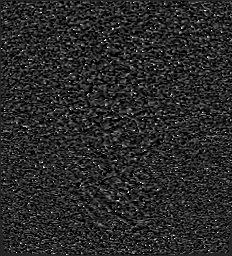

[Series 17: swi_images · axial · 3.0mm · 0.90mm/px · z∈[+3,+168]mm · 4 of 60 slices shown]
[im 1/60]
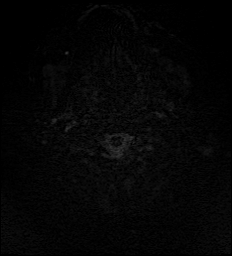
[im 20/60]
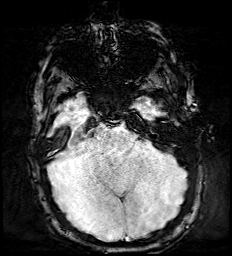
[im 40/60]
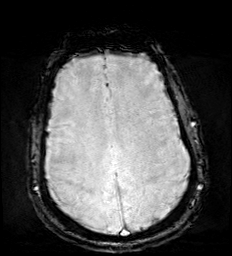
[im 60/60]
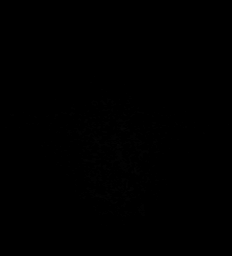

[Series 19: FLAIR · axial · 3.0mm · 0.53mm/px · z∈[+8,+159]mm · 4 of 55 slices shown]
[im 1/55]
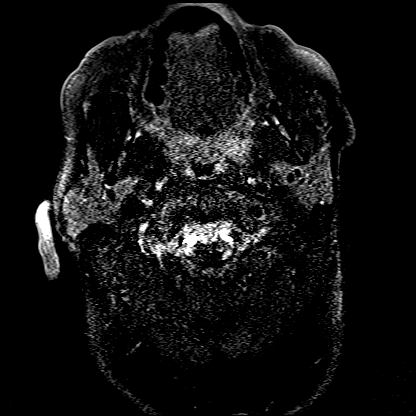
[im 19/55]
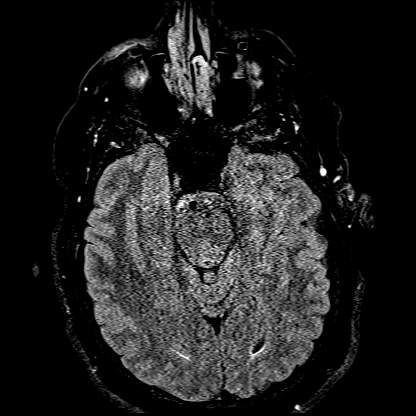
[im 37/55]
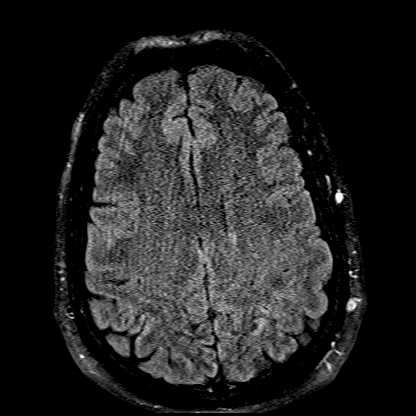
[im 55/55]
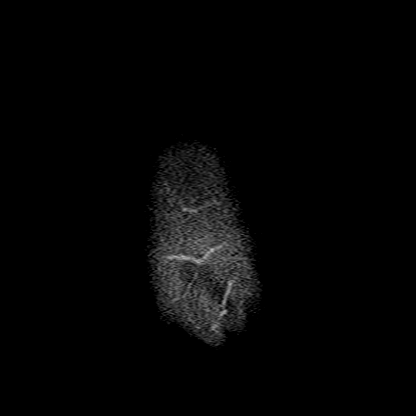

[Series 20: T1 · axial · 1.0mm · 0.98mm/px · z∈[-1,+164]mm · 12 of 176 slices shown (2 of 2)]
[im 1/176]
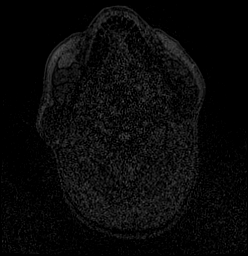
[im 16/176]
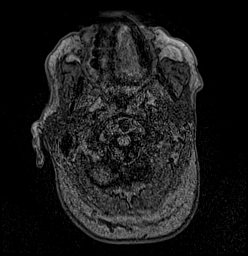
[im 32/176]
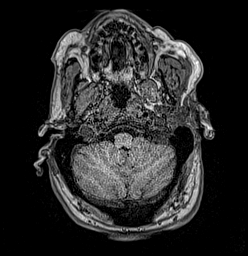
[im 48/176]
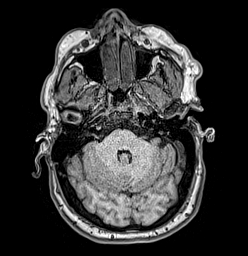
[im 64/176]
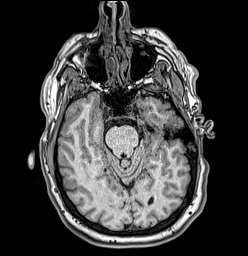
[im 80/176]
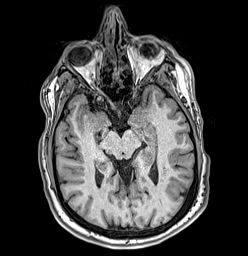
[im 96/176]
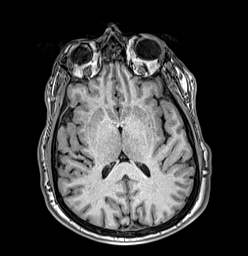
[im 112/176]
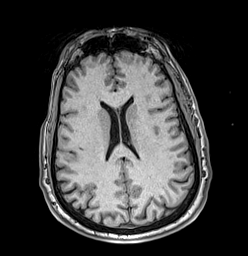
[im 128/176]
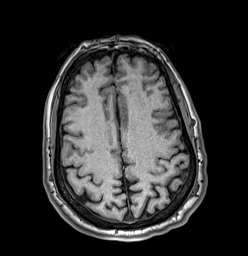
[im 144/176]
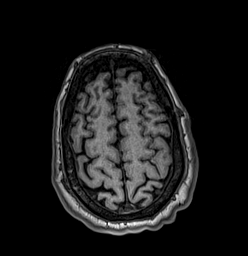
[im 160/176]
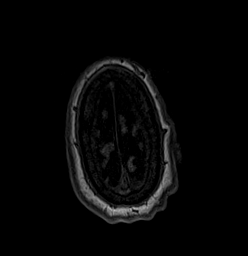
[im 176/176]
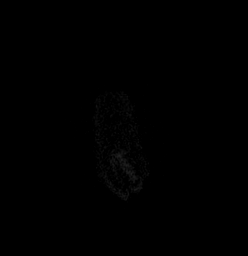

[Series 21: T2 · coronal · 5.0mm · 0.57mm/px · 2 of 35 slices shown (2 of 2)]
[im 1/35]
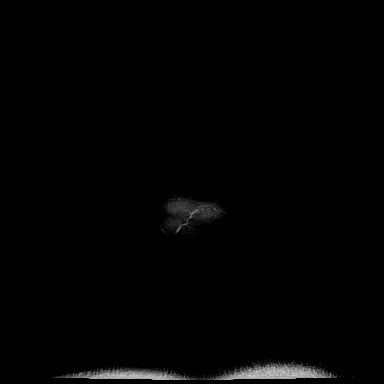
[im 35/35]
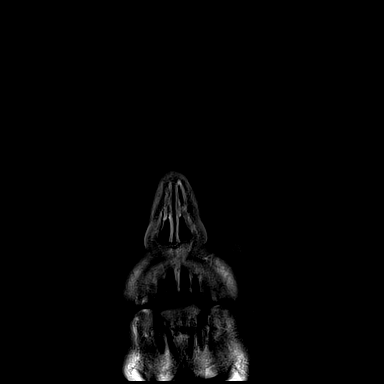

[48 of 48 positions shown; findings below may reference images not displayed]

FINDINGS: Brain: No acute infarct, mass effect or extra-axial collection. No
acute or chronic hemorrhage. Normal white matter signal, parenchymal
volume and CSF spaces. The midline structures are normal.

Vascular: Major flow voids are preserved.

Skull and upper cervical spine: Normal calvarium and skull base.
Visualized upper cervical spine and soft tissues are normal.

Sinuses/Orbits:No paranasal sinus fluid levels or advanced mucosal
thickening. No mastoid or middle ear effusion. Normal orbits.
IMPRESSION: Normal brain MRI.

## 2021-02-08 MED ORDER — ASPIRIN 325 MG PO TABS
325.0000 mg | ORAL_TABLET | Freq: Once | ORAL | Status: AC
  Administered 2021-02-08: 325 mg via ORAL
  Filled 2021-02-08: qty 1

## 2021-02-08 MED ORDER — DOCUSATE SODIUM 100 MG PO CAPS: 100.0000 mg | ORAL_CAPSULE | Freq: Two times a day (BID) | ORAL | 2 refills | Status: AC

## 2021-02-08 MED ORDER — ASPIRIN 325 MG PO TABS: 325.0000 mg | ORAL_TABLET | Freq: Every day | ORAL | Status: DC

## 2021-02-08 MED ORDER — FERROUS SULFATE 325 (65 FE) MG PO TBEC: 325.0000 mg | DELAYED_RELEASE_TABLET | Freq: Two times a day (BID) | ORAL | 1 refills | Status: DC

## 2021-02-08 MED ORDER — VITAMIN B-12 1000 MCG PO TABS: 1000.0000 ug | ORAL_TABLET | Freq: Every day | ORAL | 1 refills | Status: AC

## 2021-02-08 MED ORDER — VITAMIN D (ERGOCALCIFEROL) 1.25 MG (50000 UNIT) PO CAPS: 50000.0000 [IU] | ORAL_CAPSULE | ORAL | 0 refills | Status: AC

## 2021-02-08 MED ORDER — LABETALOL HCL 5 MG/ML IV SOLN: 5.0000 mg | INTRAVENOUS | Status: DC | PRN

## 2021-02-08 MED ORDER — POTASSIUM CHLORIDE CRYS ER 20 MEQ PO TBCR
40.0000 meq | EXTENDED_RELEASE_TABLET | Freq: Once | ORAL | Status: AC
  Administered 2021-02-08: 40 meq via ORAL
  Filled 2021-02-08: qty 2

## 2021-02-08 MED ORDER — TRAZODONE HCL 100 MG PO TABS
100.0000 mg | ORAL_TABLET | Freq: Every day | ORAL | Status: DC
  Filled 2021-02-08: qty 1

## 2021-02-08 NOTE — Progress Notes (Signed)
Sent Rx for vitamin D and vitamin B12 to his pharmacy.  See discharge summary for details

## 2021-02-08 NOTE — Discharge Summary (Signed)
Physician Discharge Summary  Alex Patel [id number] DOB: August 18, 1965 DOA: 02/07/2021  PCP: Marina Goodell, MD  Admit date: 02/07/2021 Discharge date: 02/08/2021 Admitted From: Home Disposition: Home Recommendations for Outpatient Follow-up:  Follow ups as below. Please obtain CBC/BMP/Mag at follow up Please make sure he is taking vitamin D Recommend monthly vitamin B12 injection Please follow up on the following pending results: Hemoglobin A1c, vitamin D level and vitamin B12 level Home Health: Not indicated Equipment/Devices: Not indicated Discharge Condition: Stable CODE STATUS: Full code  Follow-up Information     Feldpausch, Madaline Guthrie, MD. Schedule an appointment as soon as possible for a visit in 1 week(s).   Specialty: Family Medicine Contact information: [address] [facility]IMARY CARE [place] [address] [phone]                Hospital Course: 55 year old male with PMH of migraine headaches, insomnia, anemia, obesity, gastric bypass surgery and tobacco use disorder presenting with "ripping chest pain" and headache different from his usual migraine and brief amnesia.  In ED, vital signs and BMP within normal.  Hgb 10.4 (about baseline).  Troponin 44>> 36.  EKG normal sinus rhythm with LAD.  CXR without acute finding.  CT head, CT angio chest/Ortho and MRI brain without significant finding.    The next day, patient's chest pain resolved.  He had headache typical of his migraine that has improved with Imitrex and his home Cardizem.  Complete neuro exam was normal.  He felt better and ready to go home.  His LDL was 71.  A1c, vitamin B12 and vitamin D level were pending.  He had hypokalemia to 3.3 and received p.o. KCl 40x1 prior to discharge.  Patient's vitamin D level came back low at 12.74.  Vitamin B12 came back low at 100.   I sent prescription for vitamin D 50,000 international unit weekly for 8 weeks and vitamin B12 1000 mcg daily  until he follows up with his primary care doctor.  He definitely benefit from vitamin B12 injection.  I left him voicemail as well.  See individual problem list below for more on hospital course.  Discharge Diagnoses:  Atypical chest pain: Resolved.  Unclear etiology of this.  Could be atypical migraine.  Mildly elevated troponin likely demand ischemia.  No significant delta to suggest ACS.  Patient was chest pain-free overnight.  Migraine headache-improved with Imitrex and Cardizem.  MRI brain without acute finding.  Neuro exam reassuring. -Continue home medications  Vitamin D deficiency -See above  Vitamin B12 deficiency -See above  Microcytic anemia: H&H at baseline.  Chronic. -Replace B12 as above -Recommend full anemia panel at follow-up.  Tobacco use disorder: -Encouraged to quit smoking.  History of gastric bypass-patient is at risk for multiple vitamin and mineral deficiencies.  Needs regular monitoring and replacement as appropriate.  Hypokalemia -Received p.o. KCl 40 prior to discharge.   Obesity Body mass index is 34.3 kg/m.  -Encourage lifestyle change         Discharge Exam: Vitals:   02/07/21 1340 02/07/21 1927 02/07/21 2200 02/08/21 0800  BP: 113/77 120/77 126/82 93/70  Pulse: 73 70 61 77  Temp: 97.9 F (36.6 C) 98 F (36.7 C)    Resp: 18 18  18   Height: 6\' 1"  (1.854 m)     Weight: 117.9 kg     SpO2: 100% 99% 97% 100%  TempSrc: Oral Oral    BMI (Calculated): 34.31  GENERAL: No apparent distress.  Nontoxic. HEENT: MMM.  Vision and hearing grossly intact.  NECK: Supple.  No apparent JVD.  RESP:  No IWOB.  Fair aeration bilaterally. CVS:  RRR. Heart sounds normal.  ABD/GI/GU: Bowel sounds present. Soft. Non tender.  MSK/EXT:  Moves extremities. No apparent deformity. No edema.  SKIN: no apparent skin lesion or wound NEURO: Awake, alert and oriented appropriately. Speech clear. Cranial nerves II-XII intact. Motor 5/5 in all muscle groups  of UE and LE bilaterally, Normal tone. Light sensation intact in all dermatomes of upper and lower ext bilaterally. Patellar reflex symmetric.  No pronator drift.  Finger to nose intact. PSYCH: Calm. Normal affect.   Discharge Instructions  Discharge Instructions     Call MD for:  extreme fatigue   Complete by: As directed    Call MD for:  persistant dizziness or light-headedness   Complete by: As directed    Call MD for:  persistant nausea and vomiting   Complete by: As directed    Call MD for:  severe uncontrolled pain   Complete by: As directed    Diet general   Complete by: As directed    Discharge instructions   Complete by: As directed    It has been a pleasure taking care of you!  You were hospitalized with chest pain, headache and brief memory loss.  After the tests we have done, we believe your symptoms are related to migraine.  Your MRI brain and CT chest was negative.  We also noted that you have had anemia.  We recommend taking iron tablets with a stool softener.  Please follow-up with your primary care doctor for further evaluation on your anemia.  Please review your new medication list and the directions on your medications before you take them.  Follow-up with your primary care doctor in 1 to 2 weeks.   Take care,   Increase activity slowly   Complete by: As directed       Allergies as of 02/08/2021   No Known Allergies      Medication List     TAKE these medications    ALPRAZolam 0.5 MG tablet Commonly known as: XANAX   docusate sodium 100 MG capsule Commonly known as: Colace Take 1 capsule (100 mg total) by mouth 2 (two) times daily.   esomeprazole 40 MG capsule Commonly known as: NEXIUM Take 40 mg by mouth.   ferrous sulfate 325 (65 FE) MG EC tablet Take 1 tablet (325 mg total) by mouth 2 (two) times daily.   Multi-Vitamins Tabs Take 1 tablet by mouth daily.   SUMAtriptan 100 MG tablet Commonly known as: IMITREX take 1 tablet by mouth  immediately may repeat after 2 hours if needed   SUMAtriptan 6 MG/0.5ML Soaj INJECT 1 DOSE UNDER THE SKIN FOR MIGRAINES. MAY REPEAT IN 1 HOUR IF MIGRAINE PERSISTS-MAX OF 18   traZODone 100 MG tablet Commonly known as: DESYREL take 1 tablet by mouth at bedtime   verapamil 240 MG CR tablet Commonly known as: CALAN-SR Take 240 mg by mouth daily.       ASK your doctor about these medications    tamsulosin 0.4 MG Caps capsule Commonly known as: FLOMAX Take 0.4 mg by mouth daily.        Consultations: None  Procedures/Studies:   DG Chest 2 View  Result Date: 02/07/2021 CLINICAL DATA:  55 year old male with history of chest pain. EXAM: CHEST - 2 VIEW COMPARISON:  No priors. FINDINGS: Lung  volumes are low. No consolidative airspace disease. No pleural effusions. No pneumothorax. No pulmonary nodule or mass noted. Pulmonary vasculature and the cardiomediastinal silhouette are within normal limits. IMPRESSION: 1. Low lung volumes without radiographic evidence of acute cardiopulmonary disease. Electronically Signed   By: Trudie Reed M.D.   On: 02/07/2021 14:12   CT HEAD WO CONTRAST ( )  Result Date: 02/07/2021 CLINICAL DATA:  55 year old male with history of tension type headache. EXAM: CT HEAD WITHOUT CONTRAST TECHNIQUE: Contiguous axial images were obtained from the base of the skull through the vertex without intravenous contrast. COMPARISON:  No priors. FINDINGS: Brain: No evidence of acute infarction, hemorrhage, hydrocephalus, extra-axial collection or mass lesion/mass effect. Vascular: No hyperdense vessel or unexpected calcification. Skull: Normal. Negative for fracture or focal lesion. Sinuses/Orbits: Multifocal mucosal thickening noted in the paranasal sinuses, most evident in the ethmoid sinuses bilaterally, frontoethmoidal recesses and the left maxillary sinus. Some frothy secretions are also noted in the right frontal sinus. Other: None. IMPRESSION: 1. No acute  intracranial abnormalities. 2. Mild paranasal sinus disease, as above. Electronically Signed   By: Trudie Reed M.D.   On: 02/07/2021 20:12   MR BRAIN WO CONTRAST  Result Date: 02/08/2021 CLINICAL DATA:  Acute neurologic deficits EXAM: MRI HEAD WITHOUT CONTRAST TECHNIQUE: Multiplanar, multiecho pulse sequences of the brain and surrounding structures were obtained without intravenous contrast. COMPARISON:  None. FINDINGS: Brain: No acute infarct, mass effect or extra-axial collection. No acute or chronic hemorrhage. Normal white matter signal, parenchymal volume and CSF spaces. The midline structures are normal. Vascular: Major flow voids are preserved. Skull and upper cervical spine: Normal calvarium and skull base. Visualized upper cervical spine and soft tissues are normal. Sinuses/Orbits:No paranasal sinus fluid levels or advanced mucosal thickening. No mastoid or middle ear effusion. Normal orbits. IMPRESSION: Normal brain MRI. Electronically Signed   By: Deatra Robinson M.D.   On: 02/08/2021 01:22   CT Angio Chest Aorta W and/or Wo Contrast  Result Date: 02/07/2021 CLINICAL DATA:  55 year old male with history of chest and back pain. Headache. Evaluate for aortic dissection. EXAM: CT ANGIOGRAPHY CHEST WITH CONTRAST TECHNIQUE: Multidetector CT imaging of the chest was performed using the standard protocol during bolus administration of intravenous contrast. Multiplanar CT image reconstructions and MIPs were obtained to evaluate the vascular anatomy. CONTRAST:  OMNIPAQUE IOHEXOL 350 MG/ML SOLN COMPARISON:  No priors. FINDINGS: Cardiovascular: Precontrast images demonstrate no crescentic high attenuation associated with the wall of the thoracic aorta to suggest significant acute intramural hemorrhage. Study is limited by considerable pulsation artifact related to cardiac motion. With these limitations in mind no definitive evidence to suggest aortic dissection is noted. Ascending thoracic aorta,  mid arch and descending thoracic aorta are all normal in caliber measuring 3.2 cm, 2.9 cm and 2.6 cm in diameter respectively. No significant atherosclerotic disease noted in the thoracic aorta. No coronary artery calcifications. Mediastinum/Nodes: No pathologically enlarged mediastinal or hilar lymph nodes. Hilar esophagus is unremarkable in appearance. No axillary lymphadenopathy. Lungs/Pleura: Scattered areas of linear scarring are noted in the lung bases bilaterally. No acute consolidative airspace disease. No pleural effusions. No pneumothorax. No suspicious appearing pulmonary nodules or masses are noted. 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 Musculoskeletal: There are no aggressive appearing lytic or blastic lesions noted in the visualized portions of the skeleton. Review of the MIP images confirms the above findings. IMPRESSION: 1. No definite acute findings to account for the patient's symptoms. Specifically, no definite evidence of acute aortic syndrome. This non  gated CT examination is considerably limited by pulsation artifact. If there is persistent strong clinical concern for acute aortic syndrome, repeat cardiac gated chest CTA could be considered. Electronically Signed   By: Trudie Reed M.D.   On: 02/07/2021 20:09       The results of significant diagnostics from this hospitalization (including imaging, microbiology, ancillary and laboratory) are listed below for reference.     Microbiology: Recent Results (from the past 240 hour(s))  Resp Panel by RT-PCR (Flu A&B, Covid) Nasopharyngeal Swab     Status: None   Collection Time: 02/07/21  7:30 PM   Specimen: Nasopharyngeal Swab; Nasopharyngeal(NP) swabs in vial transport medium  Result Value Ref Range Status   SARS Coronavirus 2 by RT PCR NEGATIVE NEGATIVE Final    Comment: (NOTE) SARS-CoV-2 target nucleic acids are NOT DETECTED.  The SARS-CoV-2 RNA is generally detectable in upper respiratory specimens during the  acute phase of infection. The lowest concentration of SARS-CoV-2 viral copies this assay can detect is 138 copies/mL. A negative result does not preclude SARS-Cov-2 infection and should not be used as the sole basis for treatment or other patient management decisions. A negative result may occur with  improper specimen collection/handling, submission of specimen other than nasopharyngeal swab, presence of viral mutation(s) within the areas targeted by this assay, and inadequate number of viral copies(<138 copies/mL). A negative result must be combined with clinical observations, patient history, and epidemiological information. The expected result is Negative.  Fact Sheet for Patients:  BloggerCourse.com  Fact Sheet for Healthcare Providers:  SeriousBroker.it  This test is no t yet approved or cleared by the [place] FDA and  has been authorized for detection and/or diagnosis of SARS-CoV-2 by FDA under an Emergency Use Authorization (EUA). This EUA will remain  in effect (meaning this test can be used) for the duration of the COVID-19 declaration under Section 564(b)(1) of the Act, 21 U.S.C.section 360bbb-3(b)(1), unless the authorization is terminated  or revoked sooner.       Influenza A by PCR NEGATIVE NEGATIVE Final   Influenza B by PCR NEGATIVE NEGATIVE Final    Comment: (NOTE) The Xpert Xpress SARS-CoV-2/FLU/RSV plus assay is intended as an aid in the diagnosis of influenza from Nasopharyngeal swab specimens and should not be used as a sole basis for treatment. Nasal washings and aspirates are unacceptable for Xpert Xpress SARS-CoV-2/FLU/RSV testing.  Fact Sheet for Patients: BloggerCourse.com  Fact Sheet for Healthcare Providers: SeriousBroker.it  This test is not yet approved or cleared by the [place] FDA and has been authorized for detection and/or  diagnosis of SARS-CoV-2 by FDA under an Emergency Use Authorization (EUA). This EUA will remain in effect (meaning this test can be used) for the duration of the COVID-19 declaration under Section 564(b)(1) of the Act, 21 U.S.C. section 360bbb-3(b)(1), unless the authorization is terminated or revoked.  Performed at [facility], [address] [place], [place] [address]      Labs:  CBC: Recent Labs  Lab 02/07/21 1345 02/08/21 0610  WBC 8.2 7.1  HGB 10.4* 9.7*  HCT 35.4* 33.3*  MCV 75.5* 74.0*  PLT 252 228   BMP &GFR Recent Labs  Lab 02/07/21 1345 02/08/21 0610 02/08/21 0612  NA 139 138  --   K 3.5 3.3*  --   CL 109 111  --   CO2 27 26  --   GLUCOSE 85 92  --   BUN 17 16  --   CREATININE 0.87 0.64  --  CALCIUM 8.7* 8.2*  --   MG  --   --  2.3   Estimated Creatinine Clearance: 143.6 mL/min (by C-G formula based on SCr of 0.64 mg/dL). Liver & Pancreas: No results for input(s): AST, ALT, ALKPHOS, BILITOT, PROT, ALBUMIN in the last 168 hours. No results for input(s): LIPASE, AMYLASE in the last 168 hours. No results for input(s): AMMONIA in the last 168 hours. Diabetic: No results for input(s): HGBA1C in the last 72 hours. No results for input(s): GLUCAP in the last 168 hours. Cardiac Enzymes: No results for input(s): CKTOTAL, CKMB, CKMBINDEX, TROPONINI in the last 168 hours. No results for input(s): PROBNP in the last 8760 hours. Coagulation Profile: No results for input(s): INR, PROTIME in the last 168 hours. Thyroid Function Tests: No results for input(s): TSH, T4TOTAL, FREET4, T3FREE, THYROIDAB in the last 72 hours. Lipid Profile: Recent Labs    02/08/21 0613  CHOL 122  HDL 43  LDLCALC 71  TRIG 41  CHOLHDL 2.8   Anemia Panel: Recent Labs    02/08/21 0610  VITAMINB12 100*   Urine analysis: No results found for: COLORURINE, APPEARANCEUR, LABSPEC, PHURINE, GLUCOSEU, HGBUR, BILIRUBINUR, KETONESUR, PROTEINUR, UROBILINOGEN, NITRITE,  LEUKOCYTESUR Sepsis Labs: Invalid input(s): PROCALCITONIN, LACTICIDVEN   Time coordinating discharge: 45 minutes  SIGNED:  Almon Hercules, MD  [facility] 02/08/2021, 6:11 PM

## 2021-06-04 ENCOUNTER — Ambulatory Visit
Admission: EM | Admit: 2021-06-04 | Discharge: 2021-06-04 | Disposition: A | Discharge status: Home / Self Care | Payer: BC Managed Care – PPO | Attending: Emergency Medicine | Admitting: Emergency Medicine

## 2021-06-04 ENCOUNTER — Encounter: Payer: Self-pay | Admitting: Emergency Medicine

## 2021-06-04 ENCOUNTER — Other Ambulatory Visit: Payer: Self-pay

## 2021-06-04 DIAGNOSIS — M5441 Lumbago with sciatica, right side: Secondary | ICD-10-CM

## 2021-06-04 MED ORDER — KETOROLAC TROMETHAMINE 60 MG/2ML IM SOLN
30.0000 mg | Freq: Once | INTRAMUSCULAR | Status: AC
  Administered 2021-06-04: 30 mg via INTRAMUSCULAR

## 2021-06-04 MED ORDER — CYCLOBENZAPRINE HCL 10 MG PO TABS: 10.0000 mg | ORAL_TABLET | Freq: Every day | ORAL | 0 refills | Status: AC

## 2021-06-04 MED ORDER — NAPROXEN SODIUM 550 MG PO TABS: 550.0000 mg | ORAL_TABLET | Freq: Two times a day (BID) | ORAL | 0 refills | Status: AC

## 2021-06-04 NOTE — ED Provider Notes (Signed)
?[facility] ? ? ? ?CSN: [id number] ?Arrival date & time: 06/04/21  1701 ? ? ?  ? ?History   ?Chief Complaint ?Chief Complaint  ?Patient presents with  ? Back Pain  ? ? ?HPI ?Alex Patel is a 56 y.o. male.  ? ?Patient presents with right-sided lower back pain radiating down the right leg and extending into the foot with associated numbness and tingling for 3 days.  Denies precipitating event, trauma or injury, denies pushing pulling or lifting, long periods of standing or sitting.  Symptoms are worsened by lying and standing.  Is painful to bear weight onto the right leg.  Attempted use of a chiropractor who recommended patient be seen in urgent care for anti-inflammatory medications.  Able to control bowel and bladder.   ? ?History reviewed. No pertinent past medical history. ? ?Patient Active Problem List  ? Diagnosis Date Noted  ? Migraine aura, persistent, intractable 02/07/2021  ? GERD (gastroesophageal reflux disease) 02/07/2021  ? Elevated troponin 02/07/2021  ? Superficial thrombophlebitis 02/13/2016  ? Varicose veins of leg with pain, right 02/13/2016  ? ? ?Past Surgical History:  ?Procedure Laterality Date  ? APPENDECTOMY    ? GASTRIC BYPASS    ? LASER ABLATION Bilateral   ? ? ? ? ? ?Home Medications   ? ?Prior to Admission medications   ?Medication Sig Start Date End Date Taking? Authorizing Provider  ?esomeprazole (NEXIUM) 40 MG capsule Take 40 mg by mouth.   Yes [provider]  ?Multiple Vitamin (MULTI-VITAMINS) TABS Take 1 tablet by mouth daily.   Yes [provider]  ?SUMAtriptan (IMITREX) 100 MG tablet take 1 tablet by mouth immediately may repeat after 2 hours if needed 05/15/15  Yes [provider]  ?tamsulosin (FLOMAX) 0.4 MG CAPS capsule Take 0.4 mg by mouth daily.   Yes [provider]  ?vitamin B-12 (CYANOCOBALAMIN) 1000 MCG tablet Take 1 tablet (1,000 mcg total) by mouth daily. Please follow-up with your primary care doctor for injectable vitamin  B12 02/08/21  Yes Mercy Riding, MD  ?Vitamin D, Ergocalciferol, (DRISDOL) 1.25 MG (50000 UNIT) CAPS capsule Take 1 capsule (50,000 Units total) by mouth every 7 (seven) days. 02/08/21  Yes Mercy Riding, MD  ?ALPRAZolam Duanne Moron) 0.5 MG tablet  07/30/16   [provider]  ?ferrous sulfate 325 (65 FE) MG EC tablet Take 1 tablet (325 mg total) by mouth 2 (two) times daily. 02/08/21 08/07/21  Mercy Riding, MD  ?SUMAtriptan 6 MG/0.5ML SOAJ INJECT 1 DOSE UNDER THE SKIN FOR MIGRAINES. MAY REPEAT IN 1 HOUR IF MIGRAINE PERSISTS-MAX OF 18 07/09/16   [provider]  ?traZODone (DESYREL) 100 MG tablet take 1 tablet by mouth at bedtime 01/27/14   [provider]  ?verapamil (CALAN-SR) 240 MG CR tablet Take 240 mg by mouth daily.    [provider]  ? ? ?Family History ?Family History  ?Problem Relation Age of Onset  ? Hypertension Mother   ? Varicose Veins Father   ? Cancer Maternal Grandmother   ? Diabetes Maternal Grandmother   ? Varicose Veins Paternal Grandmother   ? ? ?Social History ?Social History  ? ?Tobacco Use  ? Smoking status: Every Day  ?  Packs/day: 0.50  ?  Types: Cigarettes  ? Smokeless tobacco: Never  ?Vaping Use  ? Vaping Use: Never used  ?Substance Use Topics  ? Alcohol use: No  ? Drug use: No  ? ? ? ?Allergies   ?Patient has no known  allergies. ? ? ?Review of Systems ?Review of Systems  ?Respiratory: Negative.    ?Cardiovascular: Negative.   ?Genitourinary: Negative.   ?Musculoskeletal:  Positive for back pain. Negative for arthralgias, gait problem, joint swelling, myalgias, neck pain and neck stiffness.  ?Skin: Negative.   ? ? ?Physical Exam ?Triage Vital Signs ?ED Triage Vitals  ?Enc Vitals Group  ?   BP 06/04/21 1734 132/88  ?   Pulse Rate 06/04/21 1734 79  ?   Resp 06/04/21 1734 18  ?   Temp 06/04/21 1734 98 ?F (36.7 ?C)  ?   Temp Source 06/04/21 1734 Oral  ?   SpO2 06/04/21 1734 99 %  ?   Weight 06/04/21 1731 259 lb 14.8 oz (117.9 kg)  ?   Height 06/04/21 1731 6\' 1"  (1.854  m)  ?   Head Circumference --   ?   Peak Flow --   ?   Pain Score 06/04/21 1731 8  ?   Pain Loc --   ?   Pain Edu? --   ?   Excl. in [place]? --   ? ?No data found. ? ?Updated Vital Signs ?BP 132/88 (BP Location: Left Arm)   Pulse 79   Temp 98 ?F (36.7 ?C) (Oral)   Resp 18   Ht 6\' 1"  (1.854 m)   Wt 259 lb 14.8 oz (117.9 kg)   SpO2 99%   BMI 34.29 kg/m?  ? ?Visual Acuity ?Right Eye Distance:   ?Left Eye Distance:   ?Bilateral Distance:   ? ?Right Eye Near:   ?Left Eye Near:    ?Bilateral Near:    ? ?Physical Exam ?Constitutional:   ?   Appearance: Normal appearance.  ?HENT:  ?   Head: Normocephalic.  ?Pulmonary:  ?   Effort: Pulmonary effort is normal.  ?Musculoskeletal:  ?   Comments: Tenderness over the lower right latissimus dorsi, no ecchymosis, swelling or deformity noted, no point tenderness noted, range of motion intact, able to bear weight as patient is walked into exam room without assistance  ?Skin: ?   General: Skin is warm and dry.  ?Neurological:  ?   Mental Status: He is alert and oriented to person, place, and time. Mental status is at baseline.  ?Psychiatric:     ?   Mood and Affect: Mood normal.     ?   Behavior: Behavior normal.  ? ? ? ?UC Treatments / Results  ?Labs ?(all labs ordered are listed, but only abnormal results are displayed) ?Labs Reviewed - No data to display ? ?EKG ? ? ?Radiology ?No results found. ? ?Procedures ?Procedures (including critical care time) ? ?Medications Ordered in UC ?Medications - No data to display ? ?Initial Impression / Assessment and Plan / UC Course  ?I have reviewed the triage vital signs and the nursing notes. ? ?Pertinent labs & imaging results that were available during my care of the patient were reviewed by me and considered in my medical decision making (see chart for details). ? ?Acute right-sided low back pain with sciatica ? ?Etiology of symptoms is most likely muscular, discussed with patient, will defer imaging today, Toradol injection given in  office for pain management, naproxen 500 mg twice daily for 7 days and Flexeril to be used as needed prescribed, recommended RICE, heat, pillows for support, activity as tolerated, given walking referral to orthopedics if pain with continues to persist, work note given ?Final Clinical Impressions(s) / UC Diagnoses  ? ?Final diagnoses:  ?None  ? ?  Discharge Instructions   ?None ?  ? ?ED Prescriptions   ?None ?  ? ?PDMP not reviewed this encounter. ?  ?Hans Eden, NP ?06/04/21 1816 ? ?

## 2021-06-04 NOTE — ED Triage Notes (Signed)
Pt c/o lower back pain that radiates down his right leg. Started yesterday.  ?

## 2021-06-04 NOTE — Discharge Instructions (Signed)
Your pain is most likely caused by irritation to the muscles which is compressing a nerve causing your pain to radiate down your leg ? ?Starting tomorrow take naproxen twice a day for the remainder of the week, this medication is to further help reduce inflammation ? ?You may use muscle relaxer for additional comfort at bedtime, be mindful this medication may make you drowsy ? ?You may use heating pad in 15 minute intervals as needed for additional comfort or you may find comfort in using ice in 10-15 minutes over affected area ? ?Begin stretching affected area daily for 10 minutes as tolerated to further loosen muscles  ? ?When lying down place pillow underneath and between knees for support ? ?Can try sleeping without pillow on firm mattress  ? ?Practice good posture: head back, shoulders back, chest forward, pelvis back and weight distributed evenly on both legs ? ?If pain persist after recommended treatment or reoccurs if may be beneficial to follow up with orthopedic specialist for evaluation, this doctor specializes in the bones and can manage your symptoms long-term with options such as but not limited to imaging, medications or physical therapy  ?  ?

## 2021-06-19 ENCOUNTER — Other Ambulatory Visit: Payer: Self-pay

## 2021-06-19 ENCOUNTER — Emergency Department
Admission: EM | Admit: 2021-06-19 | Discharge: 2021-06-19 | Disposition: A | Discharge status: Home / Self Care | Payer: BC Managed Care – PPO | Attending: Emergency Medicine | Admitting: Emergency Medicine

## 2021-06-19 ENCOUNTER — Emergency Department: Payer: BC Managed Care – PPO

## 2021-06-19 ENCOUNTER — Encounter: Payer: Self-pay | Admitting: Intensive Care

## 2021-06-19 DIAGNOSIS — K458 Other specified abdominal hernia without obstruction or gangrene: Secondary | ICD-10-CM | POA: Insufficient documentation

## 2021-06-19 DIAGNOSIS — M549 Dorsalgia, unspecified: Secondary | ICD-10-CM | POA: Diagnosis present

## 2021-06-19 DIAGNOSIS — M5416 Radiculopathy, lumbar region: Secondary | ICD-10-CM | POA: Insufficient documentation

## 2021-06-19 DIAGNOSIS — M5116 Intervertebral disc disorders with radiculopathy, lumbar region: Secondary | ICD-10-CM

## 2021-06-19 IMAGING — MR MR LUMBAR SPINE W/O CM
5 series · 31 of 48 positions shown · non-contrast
Comparison: None.

CLINICAL DATA: Lumbar radiculopathy, symptoms persist with > 6 wks
treatment

EXAM:
MRI LUMBAR SPINE WITHOUT CONTRAST
TECHNIQUE: Multiplanar, multisequence MR imaging of the lumbar spine was
performed. No intravenous contrast was administered.

[Series 5: T2 · sagittal · 4.0mm · 0.88mm/px · 6 of 17 slices shown (1 of 2)]
[im 1/17]
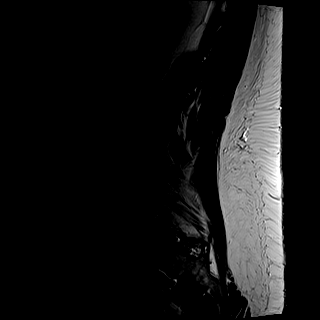
[im 4/17]
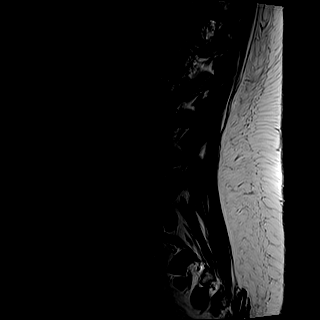
[im 7/17]
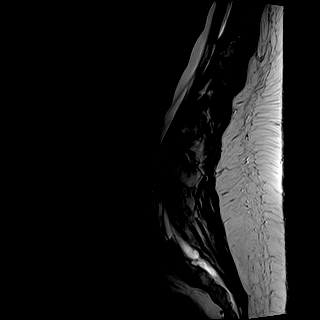
[im 10/17]
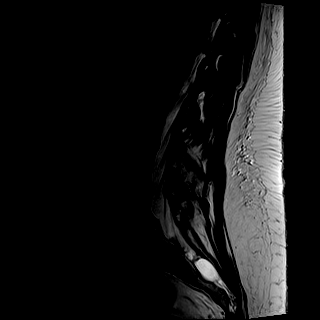
[im 13/17]
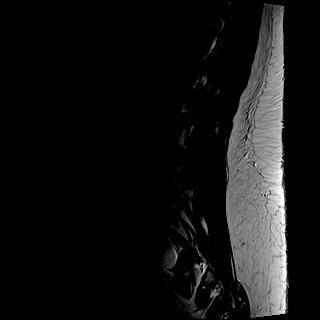
[im 17/17]
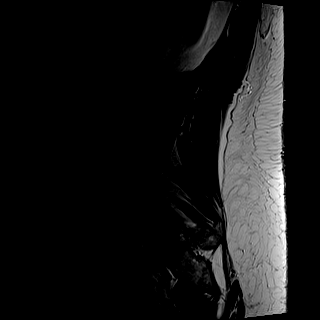

[Series 6: T1 · sagittal · 4.0mm · 0.88mm/px · 6 of 17 slices shown (1 of 2)]
[im 1/17]
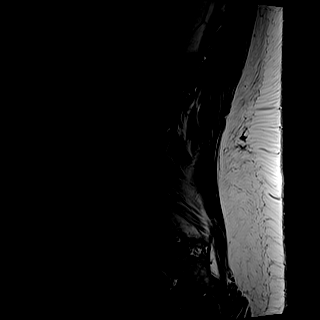
[im 4/17]
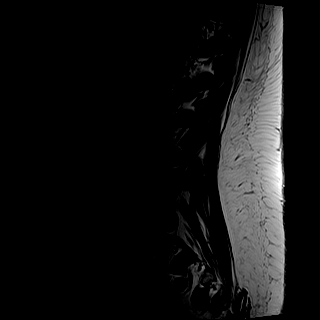
[im 7/17]
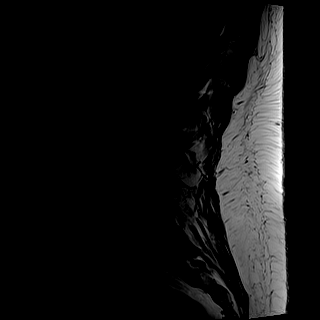
[im 10/17]
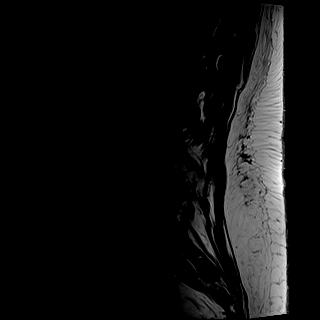
[im 13/17]
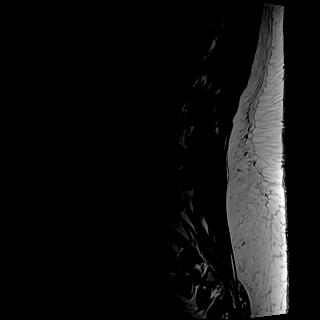
[im 17/17]
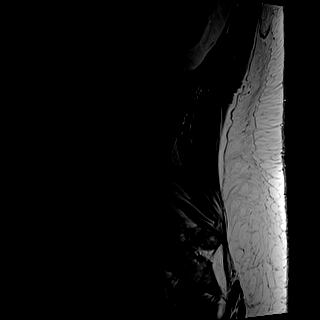

[Series 7: STIR · sagittal · 4.0mm · 0.44mm/px · 1 of 17 slices shown]
[im 1/17]
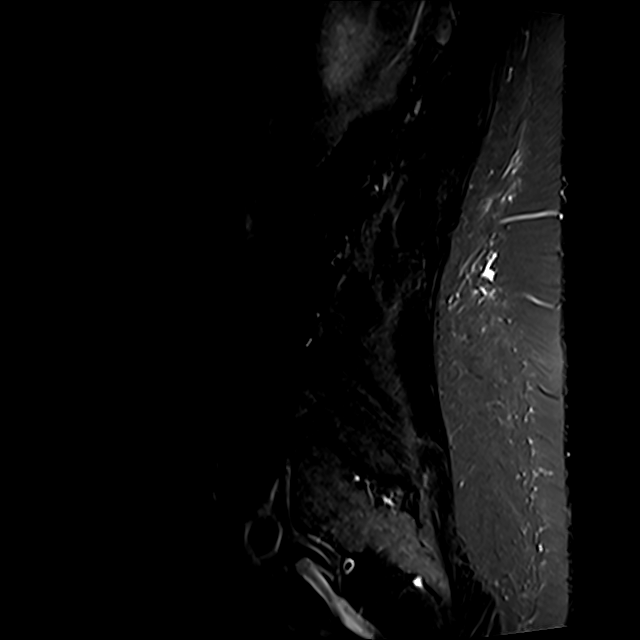

[Series 8: T2 · axial · 4.0mm · 0.78mm/px · z∈[+4,+221]mm · 9 of 39 slices shown (2 of 2)]
[im 1/39]
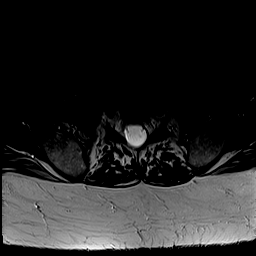
[im 6/39]
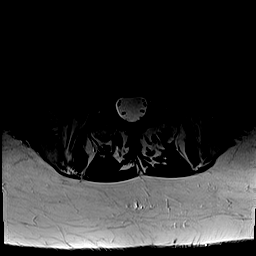
[im 11/39]
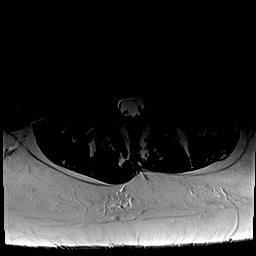
[im 17/39]
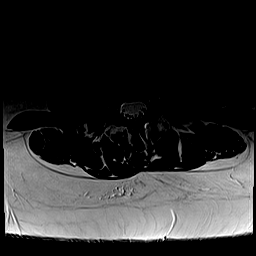
[im 20/39]
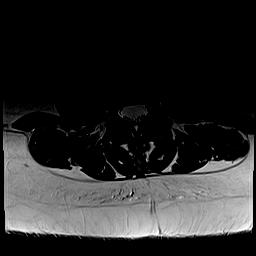
[im 22/39]
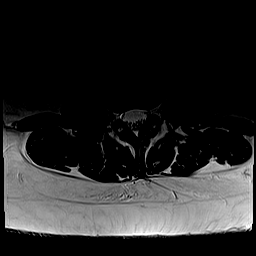
[im 28/39]
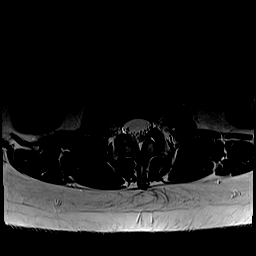
[im 33/39]
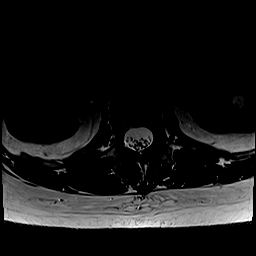
[im 39/39]
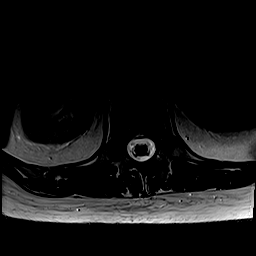

[Series 9: T1 · axial · 4.0mm · 0.39mm/px · z∈[+4,+221]mm · 9 of 39 slices shown (2 of 2)]
[im 1/39]
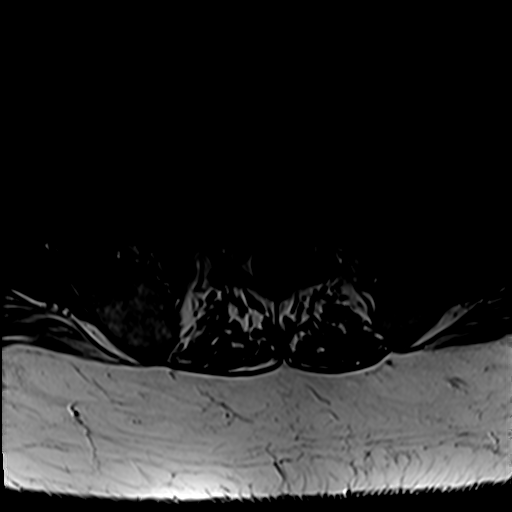
[im 6/39]
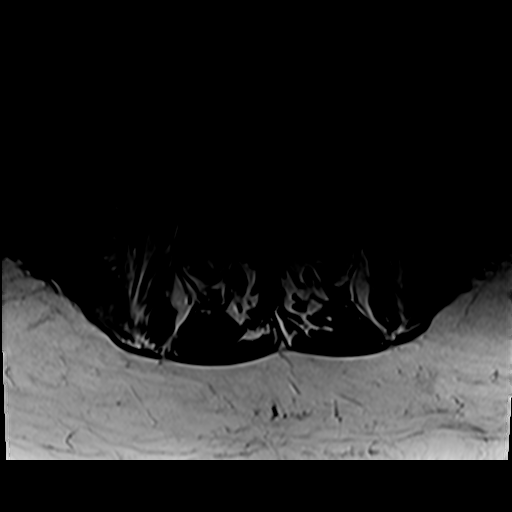
[im 11/39]
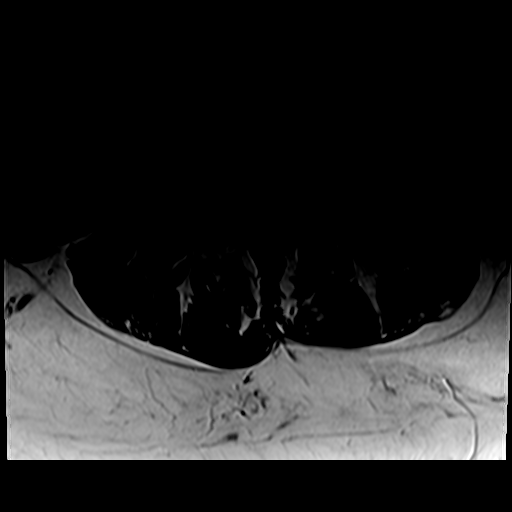
[im 17/39]
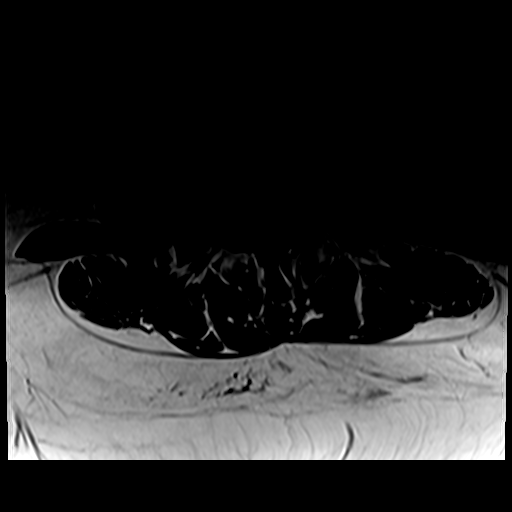
[im 20/39]
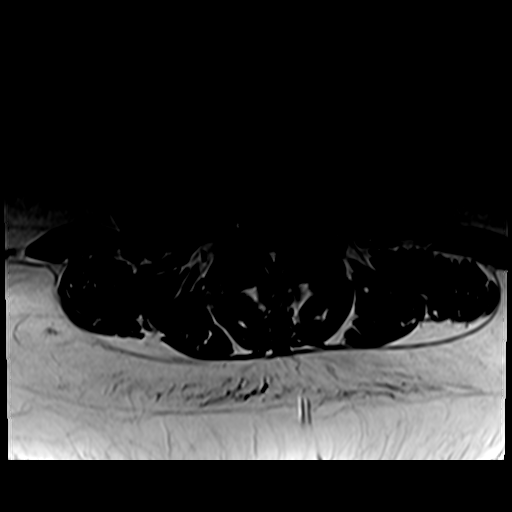
[im 22/39]
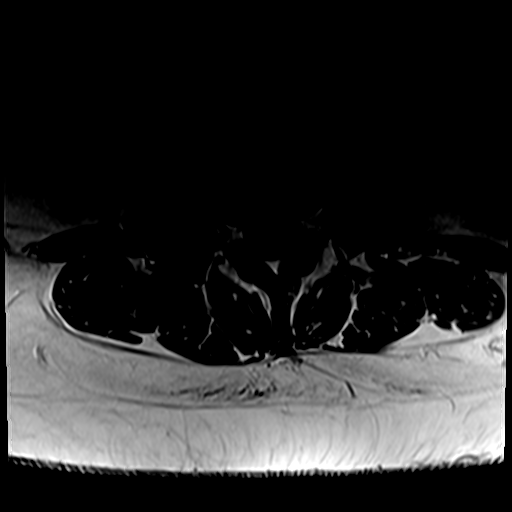
[im 28/39]
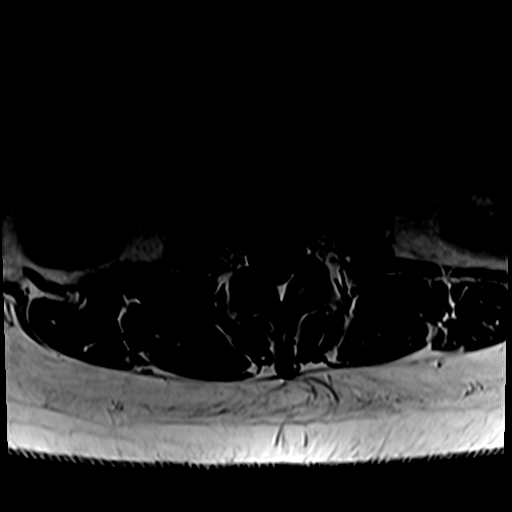
[im 33/39]
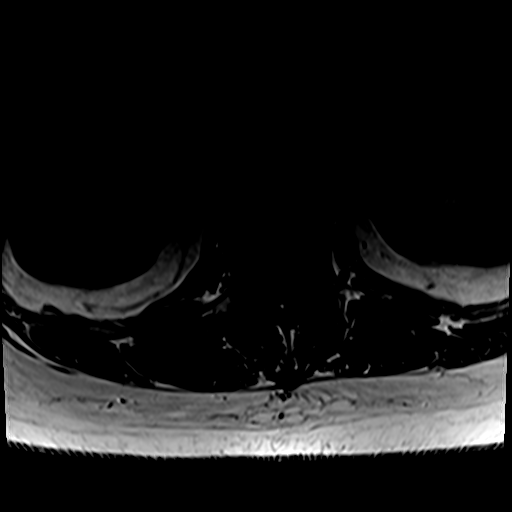
[im 39/39]
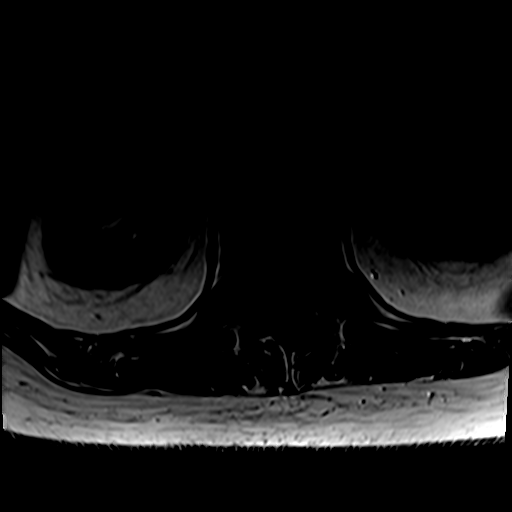

[31 of 48 positions shown; findings below may reference images not displayed]

FINDINGS: Segmentation:  Standard.

Alignment:  No significant anteroposterior listhesis.

Vertebrae: Mild degenerative endplate irregularity. No marrow edema.
No suspicious osseous lesion.

Conus medullaris and cauda equina: Conus extends to the L1 level.
Conus and cauda equina appear normal.

Paraspinal and other soft tissues: Unremarkable.

Disc levels:

L1-L2:  No stenosis.

L2-L3:  No stenosis.

L3-L4:  Trace disc bulge.  No stenosis.

L4-L5: Disc bulge with superimposed right subarticular/foraminal
protrusion and endplate osteophytic ridging. No canal stenosis.
Narrowing of the right subarticular recess with compression of the
traversing L5 nerve roots. Mild right foraminal stenosis. No left
foraminal stenosis.

L5-S1: Disc bulge with superimposed left foraminal protrusion and
endplate osteophytic ridging. No canal stenosis. Minor right
foraminal stenosis. Mild left foraminal stenosis.
IMPRESSION: Lower lumbar degenerative changes as detailed above. Most notably, a
disc herniation at L4-L5 compresses the traversing right L5 nerve
roots.

## 2021-06-19 MED ORDER — HYDROMORPHONE HCL 1 MG/ML IJ SOLN
0.5000 mg | Freq: Once | INTRAMUSCULAR | Status: AC
  Administered 2021-06-19: 0.5 mg via INTRAVENOUS
  Filled 2021-06-19: qty 1

## 2021-06-19 MED ORDER — ONDANSETRON 4 MG PO TBDP
4.0000 mg | ORAL_TABLET | Freq: Once | ORAL | Status: AC
Start: 2021-06-19 — End: 2021-06-19
  Administered 2021-06-19: 4 mg via ORAL
  Filled 2021-06-19: qty 1

## 2021-06-19 MED ORDER — OXYCODONE-ACETAMINOPHEN 5-325 MG PO TABS
1.0000 | ORAL_TABLET | ORAL | Status: AC | PRN
  Administered 2021-06-19 (×2): 1 via ORAL
  Filled 2021-06-19 (×2): qty 1

## 2021-06-19 MED ORDER — LORAZEPAM 2 MG/ML IJ SOLN
1.0000 mg | Freq: Once | INTRAMUSCULAR | Status: AC
  Administered 2021-06-19: 1 mg via INTRAVENOUS
  Filled 2021-06-19: qty 1

## 2021-06-19 NOTE — ED Triage Notes (Signed)
Patient reports 2 weeks ago moving the wrong way and now having extreme back pain. Reports his PCP could not get him in for an MRI until May and sent him here for the scan. Denies injury. Patient appears in a lot of pain.  ?

## 2021-06-19 NOTE — ED Notes (Signed)
See triage note  presents with lower back pain  states pain started 2 weeks ago  denies any fall or trauma  thinks he may have moved wrong ?

## 2021-06-19 NOTE — ED Provider Notes (Signed)
? ?[facility] ?Provider Note ? ? ? Event Date/Time  ? First MD Initiated Contact with Patient 06/19/21 1548   ?  (approximate) ? ? ?History  ? ?Chief Complaint ?Back Pain ? ? ?HPI ?Alex Patel is a 56 y.o. male, history of GERD, gastric bypass, migraine, presents to the emergency department for evaluation of back pain.  Patient states that he has been experiencing significant back pain for the past 2 weeks ever since he felt his back give out.  Reports pain extending into his right lower extremity.  He has previously been treated by other providers, who will prescribe less relaxers and steroids without any relief.  He was evaluated by physical medicine and rehabilitation at [facility] clinic today, who advised him to report to the emergency department for an MRI given that he would not be able to get an MRI anytime soon on an outpatient basis.  Denies fever/chills, chest pain, shortness of breath, abdominal pain, flank pain, nausea/vomiting, bowel/bladder dysfunction, or dizziness/lightheadedness ? ?History Limitations: No limitations. ? ?    ? ? ?Physical Exam  ?Triage Vital Signs: ?ED Triage Vitals  ?Enc Vitals Group  ?   BP 06/19/21 1453 132/85  ?   Pulse Rate 06/19/21 1453 69  ?   Resp 06/19/21 1453 20  ?   Temp 06/19/21 1453 (!) 97.4 ?F (36.3 ?C)  ?   Temp Source 06/19/21 1453 Oral  ?   SpO2 06/19/21 1453 100 %  ?   Weight 06/19/21 1455 257 lb (116.6 kg)  ?   Height 06/19/21 1455 6\' 1"  (1.854 m)  ?   Head Circumference --   ?   Peak Flow --   ?   Pain Score 06/19/21 1455 9  ?   Pain Loc --   ?   Pain Edu? --   ?   Excl. in GC? --   ? ? ?Most recent vital signs: ?Vitals:  ? 06/19/21 1453  ?BP: 132/85  ?Pulse: 69  ?Resp: 20  ?Temp: (!) 97.4 ?F (36.3 ?C)  ?SpO2: 100%  ? ? ?General: Awake, NAD.  ?Skin: Warm, dry. No rashes or lesions.  ?Eyes: PERRL. Conjunctivae normal.  ?Neck: Normal ROM. No nuchal rigidity.  ?CV: Good peripheral perfusion.  ?Resp: Normal effort.  ?Abd: Soft, non-tender. No  distention.  ?Neuro: At baseline. No gross neurological deficits.  ?MSK: No gross deformities. Normal ROM in all extremities.  ? ?Other: No lumbar spinal tenderness.  Positive straight leg test in the right lower extremity, consistent with sciatica. ? ?Physical Exam ? ? ? ?ED Results / Procedures / Treatments  ?Labs ?(all labs ordered are listed, but only abnormal results are displayed) ?Labs Reviewed - No data to display ? ? ?EKG ?Not applicable. ? ? ?RADIOLOGY ? ?ED Provider Interpretation: I personally reviewed this MRI, evidence of disc herniation around L4/L5 patient my interpretation. ? ?MR LUMBAR SPINE WO CONTRAST ? ?Result Date: 06/19/2021 ?CLINICAL DATA:  Lumbar radiculopathy, symptoms persist with > 6 wks treatment EXAM: MRI LUMBAR SPINE WITHOUT CONTRAST TECHNIQUE: Multiplanar, multisequence MR imaging of the lumbar spine was performed. No intravenous contrast was administered. COMPARISON:  None. FINDINGS: Segmentation:  Standard. Alignment:  No significant anteroposterior listhesis. Vertebrae: Mild degenerative endplate irregularity. No marrow edema. No suspicious osseous lesion. Conus medullaris and cauda equina: Conus extends to the L1 level. Conus and cauda equina appear normal. Paraspinal and other soft tissues: Unremarkable. Disc levels: L1-L2:  No stenosis. L2-L3:  No stenosis. L3-L4:  Trace disc  bulge.  No stenosis. L4-L5: Disc bulge with superimposed right subarticular/foraminal protrusion and endplate osteophytic ridging. No canal stenosis. Narrowing of the right subarticular recess with compression of the traversing L5 nerve roots. Mild right foraminal stenosis. No left foraminal stenosis. L5-S1: Disc bulge with superimposed left foraminal protrusion and endplate osteophytic ridging. No canal stenosis. Minor right foraminal stenosis. Mild left foraminal stenosis. IMPRESSION: Lower lumbar degenerative changes as detailed above. Most notably, a disc herniation at L4-L5 compresses the traversing  right L5 nerve roots. Electronically Signed   By: Guadlupe Spanish M.D.   On: 06/19/2021 17:53   ? ?PROCEDURES: ? ?Critical Care performed: None. ? ?Procedures ? ? ? ?MEDICATIONS ORDERED IN ED: ?Medications  ?oxyCODONE-acetaminophen (PERCOCET/ROXICET) 5-325 MG per tablet 1 tablet (1 tablet Oral Given 06/19/21 1712)  ?ondansetron (ZOFRAN-ODT) disintegrating tablet 4 mg (4 mg Oral Given 06/19/21 1503)  ?HYDROmorphone (DILAUDID) injection 0.5 mg (0.5 mg Intravenous Given 06/19/21 1711)  ?LORazepam (ATIVAN) injection 1 mg (1 mg Intravenous Given 06/19/21 1708)  ? ? ? ?IMPRESSION / MDM / ASSESSMENT AND PLAN / ED COURSE  ?I reviewed the triage vital signs and the nursing notes. ?             ?               ? ?Differential diagnosis includes, but is not limited to, lumbosacral strain, disc herniation, lumbar radiculopathy ? ?ED Course ?Patient appears well.  Vitals within normal limits.  NAD.  Patient states that he has a very difficult time an MRI scanners due to the kyphosis in his back anxiety.  We will provide him with hydromorphone and lorazepam. ? ?MRI shows evidence of disc herniation along L4/L5.  See above for details. ? ?Assessment/Plan ?Presentation consistent with disc herniation with sciatica.  MRI confirms these findings, showing evidence of disc herniation at L4/L5 with compression of the the right L5 nerve roots.  Patient's pain is under control at this time.  He is able to ambulate well on his own.  He was just recently prescribed gabapentin today, which he will try for pain management.  Advised him to follow-up with his orthopedist. ? ?Provided the patient with anticipatory guidance, return precautions, and educational material. Encouraged the patient to return to the emergency department at any time if they begin to experience any new or worsening symptoms. Patient expressed understanding and agreed with the plan.  ? ?  ? ? ?FINAL CLINICAL IMPRESSION(S) / ED DIAGNOSES  ? ?Final diagnoses:  ?Lumbar disc  herniation with radiculopathy  ? ? ? ?Rx / DC Orders  ? ?ED Discharge Orders   ? ? None  ? ?  ? ? ? ?Note:  This document was prepared using Dragon voice recognition software and may include unintentional dictation errors. ?  ?Varney Daily, [place] ?06/20/21 0008 ? ?  ?Jene Every, MD ?06/25/21 0701 ? ?

## 2021-06-19 NOTE — ED Notes (Signed)
Back from MRI  resting at present   ?

## 2021-06-19 NOTE — Discharge Instructions (Addendum)
-  Continue taking your prescribed medications for pain. ?-Follow back up with your primary care provider and orthopedist  ?-Return to the emergency department anytime if you begin to experience any new or worsening symptoms. ?

## 2023-02-21 ENCOUNTER — Other Ambulatory Visit (INDEPENDENT_AMBULATORY_CARE_PROVIDER_SITE_OTHER): Payer: Self-pay | Admitting: Nurse Practitioner

## 2023-02-21 DIAGNOSIS — I83813 Varicose veins of bilateral lower extremities with pain: Secondary | ICD-10-CM

## 2023-02-24 ENCOUNTER — Ambulatory Visit (INDEPENDENT_AMBULATORY_CARE_PROVIDER_SITE_OTHER): Payer: Self-pay

## 2023-02-24 ENCOUNTER — Ambulatory Visit (INDEPENDENT_AMBULATORY_CARE_PROVIDER_SITE_OTHER): Payer: BC Managed Care – PPO | Admitting: Nurse Practitioner

## 2023-02-24 ENCOUNTER — Encounter (INDEPENDENT_AMBULATORY_CARE_PROVIDER_SITE_OTHER): Payer: Self-pay | Admitting: Nurse Practitioner

## 2023-02-24 VITALS — BP 108/78 | HR 68 | Resp 18 | Ht 73.0 in | Wt 261.6 lb

## 2023-02-24 DIAGNOSIS — I83813 Varicose veins of bilateral lower extremities with pain: Secondary | ICD-10-CM | POA: Diagnosis not present

## 2023-02-24 DIAGNOSIS — I83812 Varicose veins of left lower extremities with pain: Secondary | ICD-10-CM | POA: Diagnosis not present

## 2023-02-24 DIAGNOSIS — I83811 Varicose veins of right lower extremities with pain: Secondary | ICD-10-CM

## 2023-02-24 NOTE — Progress Notes (Signed)
Subjective:    Patient ID: Alex Patel, male    DOB: 04/27/1965, 56 y.o.   MRN: [id number] Chief Complaint  Patient presents with   New Patient (Initial Visit)    NP. LS JD 2018. reflux/consult. BLE VV w pain. feldpausch.    The patient returns for follow-up evaluation approximately 6 years from previous visit.  He underwent laser ablation of his right great saphenous vein and has been doing well until recently.  He notes that he is developed large varicosities on both lower extremities and that it is becoming painful for daily activities such as kneeling down.  The patient continues to have pain in the lower extremities with dependency. The pain is lessened with elevation. Graduated compression stockings, Class I (20-30 mmHg), have been worn but the stockings do not eliminate the leg pain. Over-the-counter analgesics do not improve the symptoms. The degree of discomfort continues to interfere with daily activities. The patient notes the pain in the legs is causing problems with daily exercise, at the workplace and even with household activities and maintenance such as standing in the kitchen preparing meals and doing dishes.   Venous ultrasound shows normal deep venous system, no evidence of acute or chronic DVT.  Superficial reflux is present in the left great saphenous vein.  The right great saphenous vein and lesser saphenous vein still closed by ablation however there is a large varicosity present in the right lower extremity at the patient's area of concern    Review of Systems  All other systems reviewed and are negative.      Objective:   Physical Exam Vitals reviewed.  HENT:     Head: Normocephalic.  Cardiovascular:     Rate and Rhythm: Normal rate.     Pulses: Normal pulses.  Pulmonary:     Effort: Pulmonary effort is normal.  Musculoskeletal:        General: Tenderness present.  Skin:    General: Skin is warm and dry.  Neurological:     Mental Status: He is alert and  oriented to person, place, and time.  Psychiatric:        Mood and Affect: Mood normal.        Behavior: Behavior normal.        Thought Content: Thought content normal.        Judgment: Judgment normal.     BP 108/78   Pulse 68   Resp 18   Ht 6\' 1"  (1.854 m)   Wt 261 lb 9.6 oz (118.7 kg)   BMI 34.51 kg/m   History reviewed. No pertinent past medical history.  Social History   Socioeconomic History   Marital status: Married    Spouse name: Not on file   Number of children: Not on file   Years of education: Not on file   Highest education level: Not on file  Occupational History   Not on file  Tobacco Use   Smoking status: Every Day    Current packs/day: 0.50    Types: Cigarettes   Smokeless tobacco: Never  Vaping Use   Vaping status: Never Used  Substance and Sexual Activity   Alcohol use: No   Drug use: No   Sexual activity: Not on file  Other Topics Concern   Not on file  Social History Narrative   Not on file   Social Drivers of Health   Financial Resource Strain: Low Risk  (02/14/2023)   Received from [facility]  Overall Financial Resource Strain (CARDIA)    Difficulty of Paying Living Expenses: Not very hard  Food Insecurity: No Food Insecurity (02/14/2023)   Received from [facility]   Hunger Vital Sign    Worried About Running Out of Food in the Last Year: Never true    Ran Out of Food in the Last Year: Never true  Transportation Needs: No Transportation Needs (02/14/2023)   [facility] - Transportation    In the past 12 months, has lack of transportation kept you from medical appointments or from getting medications?: No    Lack of Transportation (Non-Medical): No  Physical Activity: Not on file  Stress: Not on file  Social Connections: Not on file  Intimate Partner Violence: Not on file    Past Surgical History:  Procedure Laterality Date   APPENDECTOMY      GASTRIC BYPASS     LASER ABLATION Bilateral     Family History  Problem Relation Age of Onset   Hypertension Mother    Varicose Veins Father    Cancer Maternal Grandmother    Diabetes Maternal Grandmother    Varicose Veins Paternal Grandmother     No Known Allergies     Latest Ref Rng & Units 02/08/2021    6:10 AM 02/07/2021    1:45 PM  CBC  WBC 4.0 - 10.5 K/uL 7.1  8.2   Hemoglobin 13.0 - 17.0 g/dL 9.7  41.3   Hematocrit 39.0 - 52.0 % 33.3  35.4   Platelets 150 - 400 K/uL 228  252       CMP     Component Value Date/Time   NA 138 02/08/2021 0610   K 3.3 (L) 02/08/2021 0610   CL 111 02/08/2021 0610   CO2 26 02/08/2021 0610   GLUCOSE 92 02/08/2021 0610   BUN 16 02/08/2021 0610   CREATININE 0.64 02/08/2021 0610   CALCIUM 8.2 (L) 02/08/2021 0610   GFRNONAA >60 02/08/2021 0610     No results found.     Assessment & Plan:   1. Varicose veins of leg with pain, right (Primary) Recommend:  The patient has had successful ablation of the previously incompetent saphenous venous system but still has persistent symptoms of pain and swelling that are having a negative impact on daily life and daily activities.  Patient should undergo injection foam sclerotherapy to treat the residual varicosities.  The risks, benefits and alternative therapies were reviewed in detail with the patient.  All questions were answered.  The patient agrees to proceed with foam sclerotherapy at their convenience.  The patient will continue wearing the graduated compression stockings and using the over-the-counter pain medications to treat her symptoms.      2. Varicose veins of leg with pain, left Recommend  I have reviewed my previous  discussion with the patient regarding  varicose veins and why they cause symptoms. Patient will continue  wearing graduated compression stockings class 1 on a daily basis, beginning first thing in the morning and removing them in the evening.  The patient is  CEAP [id number].  The patient has been wearing compression for more than 12 weeks with no or little benefit.  The patient has been exercising daily for more than 12 weeks. The patient has been elevating and taking OTC pain medications for more than 12 weeks.  None of these have have eliminated the pain related to the varicose veins and venous reflux or the discomfort  regarding venous congestion.    In addition, behavioral modification including elevation during the day was again discussed and this will continue.  The patient has utilized over the counter pain medications and has been exercising.  However, at this time conservative therapy has not alleviated the patient's symptoms of leg pain and swelling  Recommend: laser ablation of the left great saphenous veins to eliminate the symptoms of pain and swelling of the lower extremities caused by the severe superficial venous reflux disease.     Current Outpatient Medications on File Prior to Visit  Medication Sig Dispense Refill   EMGALITY, 300 MG DOSE, 100 MG/ML SOSY INJECT 3 ML EVERY MONTH BY SUBCUTANEOUS ROUTE.     esomeprazole (NEXIUM) 40 MG capsule Take 40 mg by mouth.     ferrous sulfate 325 (65 FE) MG tablet Take by mouth.     SUMAtriptan (IMITREX) 100 MG tablet take 1 tablet by mouth immediately may repeat after 2 hours if needed     SUMAtriptan 6 MG/0.5ML SOAJ INJECT 1 DOSE UNDER THE SKIN FOR MIGRAINES. MAY REPEAT IN 1 HOUR IF MIGRAINE PERSISTS-MAX OF 18  1   tamsulosin (FLOMAX) 0.4 MG CAPS capsule Take 0.4 mg by mouth daily.     traZODone (DESYREL) 50 MG tablet TAKE 0.5-1 TABLETS BY MOUTH AT BEDTIME AS NEEDED FOR SLEEP.     vitamin B-12 (CYANOCOBALAMIN) 1000 MCG tablet Take 1 tablet (1,000 mcg total) by mouth daily. Please follow-up with your primary care doctor for injectable vitamin B12 90 tablet 1   cyclobenzaprine (FLEXERIL) 10 MG tablet Take 1 tablet (10 mg total) by mouth at bedtime. (Patient not taking: Reported on 02/24/2023)  10 tablet 0   Multiple Vitamin (MULTI-VITAMINS) TABS Take 1 tablet by mouth daily. (Patient not taking: Reported on 02/24/2023)     naproxen sodium (ANAPROX DS) 550 MG tablet Take 1 tablet (550 mg total) by mouth 2 (two) times daily with a meal. (Patient not taking: Reported on 02/24/2023) 30 tablet 0   Vitamin D, Ergocalciferol, (DRISDOL) 1.25 MG (50000 UNIT) CAPS capsule Take 1 capsule (50,000 Units total) by mouth every 7 (seven) days. (Patient not taking: Reported on 02/24/2023) 8 capsule 0   No current facility-administered medications on file prior to visit.    There are no Patient Instructions on file for this visit. No follow-ups on file.   Georgiana Spinner, NP

## 2023-06-10 ENCOUNTER — Encounter (INDEPENDENT_AMBULATORY_CARE_PROVIDER_SITE_OTHER): Payer: Self-pay

## 2023-06-10 NOTE — Telephone Encounter (Signed)
 Hi Tanya, can you please reach out to patient with update

## 2023-07-29 ENCOUNTER — Encounter (INDEPENDENT_AMBULATORY_CARE_PROVIDER_SITE_OTHER): Payer: Self-pay

## 2023-08-28 ENCOUNTER — Telehealth (INDEPENDENT_AMBULATORY_CARE_PROVIDER_SITE_OTHER): Payer: Self-pay | Admitting: Vascular Surgery

## 2023-08-28 ENCOUNTER — Other Ambulatory Visit (INDEPENDENT_AMBULATORY_CARE_PROVIDER_SITE_OTHER): Payer: Self-pay

## 2023-08-28 NOTE — Telephone Encounter (Signed)
 sent

## 2023-08-28 NOTE — Telephone Encounter (Signed)
 Pt is scheduled for a left leg GSV laser ablation on 8.19.25 with Dr. Vonna Guardian. Pt will need the standard RX protocol called in to [facility] in [place].

## 2023-09-01 MED ORDER — ALPRAZOLAM 0.5 MG PO TABS: ORAL_TABLET | ORAL | 0 refills | Status: AC

## 2023-10-28 ENCOUNTER — Encounter (INDEPENDENT_AMBULATORY_CARE_PROVIDER_SITE_OTHER): Payer: Self-pay | Admitting: Vascular Surgery

## 2023-10-28 ENCOUNTER — Ambulatory Visit (INDEPENDENT_AMBULATORY_CARE_PROVIDER_SITE_OTHER): Payer: Self-pay | Admitting: Vascular Surgery

## 2023-10-28 ENCOUNTER — Other Ambulatory Visit (INDEPENDENT_AMBULATORY_CARE_PROVIDER_SITE_OTHER): Payer: Self-pay | Admitting: Vascular Surgery

## 2023-10-28 VITALS — BP 122/83 | HR 65 | Wt 248.0 lb

## 2023-10-28 DIAGNOSIS — I83813 Varicose veins of bilateral lower extremities with pain: Secondary | ICD-10-CM | POA: Insufficient documentation

## 2023-10-28 NOTE — Progress Notes (Signed)
 See previous note, foam done on right leg after laser ablation on the left leg.

## 2023-10-28 NOTE — Progress Notes (Signed)
 Alex Patel is a 58 y.o. male who presents with symptomatic venous reflux  History reviewed. No pertinent past medical history.  Past Surgical History:  Procedure Laterality Date   APPENDECTOMY     GASTRIC BYPASS     LASER ABLATION Bilateral      Current Outpatient Medications:    ALPRAZolam  (XANAX ) 0.5 MG tablet, Take 1st tablet 1 hour before procedure and take 2nd tablet once arrived in the office, Disp: 2 tablet, Rfl: 0   EMGALITY, 300 MG DOSE, 100 MG/ML SOSY, INJECT 3 ML EVERY MONTH BY SUBCUTANEOUS ROUTE., Disp: , Rfl:    esomeprazole (NEXIUM) 40 MG capsule, Take 40 mg by mouth., Disp: , Rfl:    ferrous sulfate  325 (65 FE) MG tablet, Take by mouth., Disp: , Rfl:    SUMAtriptan  (IMITREX ) 100 MG tablet, take 1 tablet by mouth immediately may repeat after 2 hours if needed, Disp: , Rfl:    SUMAtriptan  6 MG/0.5ML SOAJ, INJECT 1 DOSE UNDER THE SKIN FOR MIGRAINES. MAY REPEAT IN 1 HOUR IF MIGRAINE PERSISTS-MAX OF 18, Disp: , Rfl: 1   tamsulosin (FLOMAX) 0.4 MG CAPS capsule, Take 0.4 mg by mouth daily., Disp: , Rfl:    traZODone  (DESYREL ) 50 MG tablet, TAKE 0.5-1 TABLETS BY MOUTH AT BEDTIME AS NEEDED FOR SLEEP., Disp: , Rfl:    vitamin B-12 (CYANOCOBALAMIN) 1000 MCG tablet, Take 1 tablet (1,000 mcg total) by mouth daily. Please follow-up with your primary care doctor for injectable vitamin B12, Disp: 90 tablet, Rfl: 1   cyclobenzaprine  (FLEXERIL ) 10 MG tablet, Take 1 tablet (10 mg total) by mouth at bedtime. (Patient not taking: Reported on 10/28/2023), Disp: 10 tablet, Rfl: 0   Multiple Vitamin (MULTI-VITAMINS) TABS, Take 1 tablet by mouth daily. (Patient not taking: Reported on 10/28/2023), Disp: , Rfl:    naproxen  sodium (ANAPROX  DS) 550 MG tablet, Take 1 tablet (550 mg total) by mouth 2 (two) times daily with a meal. (Patient not taking: Reported on 10/28/2023), Disp: 30 tablet, Rfl: 0   Vitamin D , Ergocalciferol , (DRISDOL ) 1.25 MG (50000 UNIT) CAPS capsule, Take 1 capsule (50,000 Units  total) by mouth every 7 (seven) days. (Patient not taking: Reported on 10/28/2023), Disp: 8 capsule, Rfl: 0  No Known Allergies   Varicose veins of leg with pain, bilateral     PLAN: The patient's left lower extremity was sterilely prepped and draped. The ultrasound machine was used to visualize the residual left great saphenous vein throughout its course which was in the mid to upper thigh. A segment in the mid thigh was selected for access. The saphenous vein was accessed in the mid thigh difficulty using ultrasound guidance with a micropuncture needle. A 0.018 wire was then placed beyond the saphenofemoral junction and the needle was removed. The 65 cm sheath was then placed over the wire and the wire and dilator were removed. The laser fiber was then placed through the sheath and its tip was placed approximately 4-5 centimeters below the saphenofemoral junction. Tumescent anesthesia was then created with a dilute lidocaine solution. Laser energy was then delivered with constant withdrawal of the sheath and laser fiber. Approximately 535 joules of energy were delivered over a length of 14 centimeters using a 1470 Hz VenaCure machine at 7 W. Sterile dressings were placed. The patient tolerated the procedure well without obvious complications.   Follow-up in 1 week with post-laser duplex.   We then turned our attention to the right leg.   Procedure: Foam sclerotherapy was performed on  the right lower extremity. Using ultrasound guidance, 5 mL of foam Sotradecol was used to inject the varicosities of the right lower extremity. Compression wraps were placed. The patient tolerated the procedure well.

## 2023-10-30 ENCOUNTER — Other Ambulatory Visit (INDEPENDENT_AMBULATORY_CARE_PROVIDER_SITE_OTHER): Payer: Self-pay | Admitting: Vascular Surgery

## 2023-10-30 DIAGNOSIS — I83813 Varicose veins of bilateral lower extremities with pain: Secondary | ICD-10-CM

## 2023-11-04 ENCOUNTER — Ambulatory Visit (INDEPENDENT_AMBULATORY_CARE_PROVIDER_SITE_OTHER): Payer: Self-pay

## 2023-11-04 DIAGNOSIS — I83813 Varicose veins of bilateral lower extremities with pain: Secondary | ICD-10-CM | POA: Diagnosis not present

## 2023-11-25 ENCOUNTER — Encounter (INDEPENDENT_AMBULATORY_CARE_PROVIDER_SITE_OTHER): Payer: Self-pay | Admitting: Nurse Practitioner

## 2023-11-25 ENCOUNTER — Ambulatory Visit (INDEPENDENT_AMBULATORY_CARE_PROVIDER_SITE_OTHER): Payer: Self-pay | Admitting: Nurse Practitioner

## 2023-11-25 VITALS — BP 111/78 | HR 93 | Resp 18 | Wt 244.4 lb

## 2023-11-25 DIAGNOSIS — I83813 Varicose veins of bilateral lower extremities with pain: Secondary | ICD-10-CM

## 2023-11-30 ENCOUNTER — Encounter (INDEPENDENT_AMBULATORY_CARE_PROVIDER_SITE_OTHER): Payer: Self-pay | Admitting: Nurse Practitioner

## 2023-11-30 NOTE — Progress Notes (Signed)
 Subjective:    Patient ID: Alex Patel, male    DOB: 10-May-1965, 58 y.o.   MRN: [id number] Chief Complaint  Patient presents with   Follow-up    4 week follow up    The patient returns to the office for followup status post laser ablation of the left great saphenous vein on 10/28/2023.  There was also some foam sclerotherapy done on the right lower extremity.  The patient notes multiple residual varicosities bilaterally which continued to hurt with dependent positions and remained tender to palpation. The patient's swelling is unchanged from preoperative status. The patient continues to wear graduated compression stockings on a daily basis but these are not eliminating the pain and discomfort. The patient continues to use over-the-counter anti-inflammatory medications to treat the pain and related symptoms but this has not given the patient relief. The patient notes the pain in the lower extremities is causing problems with daily exercise, problems at work and even with household activities such as preparing meals and doing dishes.  The patient is otherwise done well and there have been no complications related to the laser procedure or interval changes in the patient's overall   Venous ultrasound post laser shows successful laser ablation of the left great saphenous vein, no DVT identified.    Review of Systems  Cardiovascular:  Positive for leg swelling.  All other systems reviewed and are negative.      Objective:   Physical Exam Vitals reviewed.  HENT:     Head: Normocephalic.  Cardiovascular:     Rate and Rhythm: Normal rate.     Pulses: Normal pulses.  Pulmonary:     Effort: Pulmonary effort is normal.  Musculoskeletal:        General: Tenderness present.  Skin:    General: Skin is warm and dry.  Neurological:     Mental Status: He is alert and oriented to person, place, and time.  Psychiatric:        Mood and Affect: Mood normal.        Behavior: Behavior normal.         Thought Content: Thought content normal.        Judgment: Judgment normal.     BP 111/78   Pulse 93   Resp 18   Wt 244 lb 6.4 oz (110.9 kg)   BMI 32.24 kg/m   History reviewed. No pertinent past medical history.  Social History   Socioeconomic History   Marital status: Married    Spouse name: Not on file   Number of children: Not on file   Years of education: Not on file   Highest education level: Not on file  Occupational History   Not on file  Tobacco Use   Smoking status: Former    Current packs/day: 0.50    Types: Cigarettes   Smokeless tobacco: Never  Vaping Use   Vaping status: Never Used  Substance and Sexual Activity   Alcohol use: No   Drug use: No   Sexual activity: Not on file  Other Topics Concern   Not on file  Social History Narrative   Not on file   Social Drivers of Health   Financial Resource Strain: Low Risk  (02/18/2023)   Received from [facility]   Overall Financial Resource Strain (CARDIA)    Difficulty of Paying Living Expenses: Not very hard  Food Insecurity: No Food Insecurity (02/18/2023)   Received from [facility]   Hunger Vital Sign  Within the past 12 months, you worried that your food would run out before you got the money to buy more.: Never true    Within the past 12 months, the food you bought just didn't last and you didn't have money to get more.: Never true  Transportation Needs: No Transportation Needs (02/18/2023)   Received from [facility]    In the past 12 months, has lack of transportation kept you from medical appointments or from getting medications?: No    Lack of Transportation (Non-Medical): No  Physical Activity: Not on file  Stress: Not on file  Social Connections: Not on file  Intimate Partner Violence: Not on file    Past Surgical History:  Procedure Laterality Date   APPENDECTOMY     GASTRIC BYPASS     LASER  ABLATION Bilateral     Family History  Problem Relation Age of Onset   Hypertension Mother    Varicose Veins Father    Cancer Maternal Grandmother    Diabetes Maternal Grandmother    Varicose Veins Paternal Grandmother     No Known Allergies     Latest Ref Rng & Units 02/08/2021    6:10 AM 02/07/2021    1:45 PM  CBC  WBC 4.0 - 10.5 K/uL 7.1  8.2   Hemoglobin 13.0 - 17.0 g/dL 9.7  89.5   Hematocrit 39.0 - 52.0 % 33.3  35.4   Platelets 150 - 400 K/uL 228  252       CMP     Component Value Date/Time   NA 138 02/08/2021 0610   K 3.3 (L) 02/08/2021 0610   CL 111 02/08/2021 0610   CO2 26 02/08/2021 0610   GLUCOSE 92 02/08/2021 0610   BUN 16 02/08/2021 0610   CREATININE 0.64 02/08/2021 0610   CALCIUM 8.2 (L) 02/08/2021 0610   GFRNONAA >60 02/08/2021 0610     No results found.     Assessment & Plan:   1. Varicose veins of leg with pain, bilateral (Primary) Recommend:  The patient has had successful ablation of the previously incompetent saphenous venous system but still has persistent symptoms of pain and swelling that are having a negative impact on daily life and daily activities.  Patient should undergo  injection foam sclerotherapy to treat the residual varicosities.  The risks, benefits and alternative therapies were reviewed in detail with the patient.  All questions were answered.  The patient agrees to proceed with sclerotherapy at their convenience.  The patient will continue wearing the graduated compression stockings and using the over-the-counter pain medications to treat her symptoms.       Current Outpatient Medications on File Prior to Visit  Medication Sig Dispense Refill   ALPRAZolam  (XANAX ) 0.5 MG tablet Take 1st tablet 1 hour before procedure and take 2nd tablet once arrived in the office 2 tablet 0   EMGALITY, 300 MG DOSE, 100 MG/ML SOSY INJECT 3 ML EVERY MONTH BY SUBCUTANEOUS ROUTE.     esomeprazole (NEXIUM) 40 MG capsule Take 40 mg by  mouth.     ferrous sulfate  325 (65 FE) MG tablet Take by mouth.     SUMAtriptan  (IMITREX ) 100 MG tablet take 1 tablet by mouth immediately may repeat after 2 hours if needed     SUMAtriptan  6 MG/0.5ML SOAJ INJECT 1 DOSE UNDER THE SKIN FOR MIGRAINES. MAY REPEAT IN 1 HOUR IF MIGRAINE PERSISTS-MAX OF 18  1   tamsulosin (FLOMAX) 0.4 MG  CAPS capsule Take 0.4 mg by mouth daily.     traZODone  (DESYREL ) 50 MG tablet TAKE 0.5-1 TABLETS BY MOUTH AT BEDTIME AS NEEDED FOR SLEEP.     vitamin B-12 (CYANOCOBALAMIN) 1000 MCG tablet Take 1 tablet (1,000 mcg total) by mouth daily. Please follow-up with your primary care doctor for injectable vitamin B12 90 tablet 1   cyclobenzaprine  (FLEXERIL ) 10 MG tablet Take 1 tablet (10 mg total) by mouth at bedtime. (Patient not taking: Reported on 11/25/2023) 10 tablet 0   Multiple Vitamin (MULTI-VITAMINS) TABS Take 1 tablet by mouth daily. (Patient not taking: Reported on 11/25/2023)     naproxen  sodium (ANAPROX  DS) 550 MG tablet Take 1 tablet (550 mg total) by mouth 2 (two) times daily with a meal. (Patient not taking: Reported on 11/25/2023) 30 tablet 0   Vitamin D , Ergocalciferol , (DRISDOL ) 1.25 MG (50000 UNIT) CAPS capsule Take 1 capsule (50,000 Units total) by mouth every 7 (seven) days. (Patient not taking: Reported on 11/25/2023) 8 capsule 0   No current facility-administered medications on file prior to visit.    There are no Patient Instructions on file for this visit. No follow-ups on file.   Alex Mentor E Jacy Howat, NP

## 2023-12-08 ENCOUNTER — Telehealth (INDEPENDENT_AMBULATORY_CARE_PROVIDER_SITE_OTHER): Payer: Self-pay | Admitting: Vascular Surgery

## 2023-12-08 NOTE — Telephone Encounter (Signed)
 LVM for pt TCB and schedule FOAM sclero with Dr. Marea.  right leg FOAM sclero. see jd. no prior auth req. - 2 units

## 2023-12-30 ENCOUNTER — Ambulatory Visit (INDEPENDENT_AMBULATORY_CARE_PROVIDER_SITE_OTHER): Admitting: Vascular Surgery

## 2024-01-23 ENCOUNTER — Encounter (INDEPENDENT_AMBULATORY_CARE_PROVIDER_SITE_OTHER): Payer: Self-pay | Admitting: Vascular Surgery

## 2024-01-23 ENCOUNTER — Ambulatory Visit (INDEPENDENT_AMBULATORY_CARE_PROVIDER_SITE_OTHER): Admitting: Vascular Surgery

## 2024-01-23 VITALS — BP 122/82 | HR 62 | Resp 18 | Ht 73.0 in | Wt 244.0 lb

## 2024-01-23 DIAGNOSIS — I83811 Varicose veins of right lower extremities with pain: Secondary | ICD-10-CM | POA: Diagnosis not present

## 2024-01-23 NOTE — Progress Notes (Signed)
 Alex Patel is a 58 y.o.male who presents with painful varicose veins of the right leg  No past medical history on file.  Past Surgical History:  Procedure Laterality Date   APPENDECTOMY     GASTRIC BYPASS     LASER ABLATION Bilateral     Current Outpatient Medications  Medication Sig Dispense Refill   ALPRAZolam  (XANAX ) 0.5 MG tablet Take 1st tablet 1 hour before procedure and take 2nd tablet once arrived in the office 2 tablet 0   cyclobenzaprine  (FLEXERIL ) 10 MG tablet Take 1 tablet (10 mg total) by mouth at bedtime. (Patient not taking: Reported on 11/25/2023) 10 tablet 0   EMGALITY, 300 MG DOSE, 100 MG/ML SOSY INJECT 3 ML EVERY MONTH BY SUBCUTANEOUS ROUTE.     esomeprazole (NEXIUM) 40 MG capsule Take 40 mg by mouth.     ferrous sulfate  325 (65 FE) MG tablet Take by mouth.     Multiple Vitamin (MULTI-VITAMINS) TABS Take 1 tablet by mouth daily. (Patient not taking: Reported on 11/25/2023)     naproxen  sodium (ANAPROX  DS) 550 MG tablet Take 1 tablet (550 mg total) by mouth 2 (two) times daily with a meal. (Patient not taking: Reported on 11/25/2023) 30 tablet 0   SUMAtriptan  (IMITREX ) 100 MG tablet take 1 tablet by mouth immediately may repeat after 2 hours if needed     SUMAtriptan  6 MG/0.5ML SOAJ INJECT 1 DOSE UNDER THE SKIN FOR MIGRAINES. MAY REPEAT IN 1 HOUR IF MIGRAINE PERSISTS-MAX OF 18  1   tamsulosin (FLOMAX) 0.4 MG CAPS capsule Take 0.4 mg by mouth daily.     traZODone  (DESYREL ) 50 MG tablet TAKE 0.5-1 TABLETS BY MOUTH AT BEDTIME AS NEEDED FOR SLEEP.     vitamin B-12 (CYANOCOBALAMIN) 1000 MCG tablet Take 1 tablet (1,000 mcg total) by mouth daily. Please follow-up with your primary care doctor for injectable vitamin B12 90 tablet 1   Vitamin D , Ergocalciferol , (DRISDOL ) 1.25 MG (50000 UNIT) CAPS capsule Take 1 capsule (50,000 Units total) by mouth every 7 (seven) days. (Patient not taking: Reported on 11/25/2023) 8 capsule 0   No current facility-administered medications for this  visit.    No Known Allergies  Indication: Patient presents with symptomatic varicose veins of the right lower extremity.  Procedure: Foam sclerotherapy was performed on the right lower extremity. Using ultrasound guidance, 5 mL of foam Sotradecol was used to inject the varicosities of the right lower extremity. Compression wraps were placed. The patient tolerated the procedure well.

## 2024-01-27 ENCOUNTER — Telehealth (INDEPENDENT_AMBULATORY_CARE_PROVIDER_SITE_OTHER): Payer: Self-pay | Admitting: Vascular Surgery

## 2024-01-27 NOTE — Telephone Encounter (Signed)
 error

## 2024-02-10 ENCOUNTER — Encounter (INDEPENDENT_AMBULATORY_CARE_PROVIDER_SITE_OTHER): Payer: Self-pay | Admitting: Vascular Surgery

## 2024-02-10 ENCOUNTER — Ambulatory Visit (INDEPENDENT_AMBULATORY_CARE_PROVIDER_SITE_OTHER): Admitting: Vascular Surgery

## 2024-02-10 VITALS — BP 122/81 | HR 67 | Resp 18 | Wt 245.0 lb

## 2024-02-10 DIAGNOSIS — I83813 Varicose veins of bilateral lower extremities with pain: Secondary | ICD-10-CM | POA: Diagnosis not present

## 2024-02-10 NOTE — Progress Notes (Signed)
 Alex Patel is a 58 y.o.male who presents with painful varicose veins of the right leg  No past medical history on file.  Past Surgical History:  Procedure Laterality Date   APPENDECTOMY     GASTRIC BYPASS     LASER ABLATION Bilateral     Current Outpatient Medications  Medication Sig Dispense Refill   ALPRAZolam  (XANAX ) 0.5 MG tablet Take 1st tablet 1 hour before procedure and take 2nd tablet once arrived in the office 2 tablet 0   EMGALITY, 300 MG DOSE, 100 MG/ML SOSY INJECT 3 ML EVERY MONTH BY SUBCUTANEOUS ROUTE.     esomeprazole (NEXIUM) 40 MG capsule Take 40 mg by mouth.     ferrous sulfate  325 (65 FE) MG tablet Take by mouth.     SUMAtriptan  (IMITREX ) 100 MG tablet take 1 tablet by mouth immediately may repeat after 2 hours if needed     SUMAtriptan  6 MG/0.5ML SOAJ INJECT 1 DOSE UNDER THE SKIN FOR MIGRAINES. MAY REPEAT IN 1 HOUR IF MIGRAINE PERSISTS-MAX OF 18  1   tamsulosin (FLOMAX) 0.4 MG CAPS capsule Take 0.4 mg by mouth daily.     traZODone  (DESYREL ) 50 MG tablet TAKE 0.5-1 TABLETS BY MOUTH AT BEDTIME AS NEEDED FOR SLEEP.     vitamin B-12 (CYANOCOBALAMIN) 1000 MCG tablet Take 1 tablet (1,000 mcg total) by mouth daily. Please follow-up with your primary care doctor for injectable vitamin B12 90 tablet 1   cyclobenzaprine  (FLEXERIL ) 10 MG tablet Take 1 tablet (10 mg total) by mouth at bedtime. (Patient not taking: Reported on 02/10/2024) 10 tablet 0   Multiple Vitamin (MULTI-VITAMINS) TABS Take 1 tablet by mouth daily. (Patient not taking: Reported on 02/10/2024)     naproxen  sodium (ANAPROX  DS) 550 MG tablet Take 1 tablet (550 mg total) by mouth 2 (two) times daily with a meal. (Patient not taking: Reported on 02/10/2024) 30 tablet 0   Vitamin D , Ergocalciferol , (DRISDOL ) 1.25 MG (50000 UNIT) CAPS capsule Take 1 capsule (50,000 Units total) by mouth every 7 (seven) days. (Patient not taking: Reported on 02/10/2024) 8 capsule 0   No current facility-administered medications for this  visit.    No Known Allergies  Indication: Patient presents with symptomatic varicose veins of the right lower extremity.  Procedure: Foam sclerotherapy was performed on the right lower extremity. Using ultrasound guidance, 5 mL of foam Sotradecol was used to inject the varicosities of the right lower extremity. Compression wraps were placed. The patient tolerated the procedure well.
